# Patient Record
Sex: Male | Born: 1935 | Race: Black or African American | Hispanic: No | Marital: Married | Smoking: Former smoker
Health system: Southern US, Community
[De-identification: ages and names within clinical notes are randomized; demographics above are authoritative.]

## PROBLEM LIST (undated history)

## (undated) DIAGNOSIS — J449 Chronic obstructive pulmonary disease, unspecified: Secondary | ICD-10-CM

## (undated) DIAGNOSIS — M109 Gout, unspecified: Secondary | ICD-10-CM

## (undated) DIAGNOSIS — E785 Hyperlipidemia, unspecified: Secondary | ICD-10-CM

## (undated) DIAGNOSIS — F329 Major depressive disorder, single episode, unspecified: Secondary | ICD-10-CM

## (undated) DIAGNOSIS — K449 Diaphragmatic hernia without obstruction or gangrene: Secondary | ICD-10-CM

## (undated) DIAGNOSIS — Z860100 Personal history of colon polyps, unspecified: Secondary | ICD-10-CM

## (undated) DIAGNOSIS — I2510 Atherosclerotic heart disease of native coronary artery without angina pectoris: Secondary | ICD-10-CM

## (undated) DIAGNOSIS — I10 Essential (primary) hypertension: Secondary | ICD-10-CM

## (undated) DIAGNOSIS — B3781 Candidal esophagitis: Secondary | ICD-10-CM

## (undated) DIAGNOSIS — F32A Depression, unspecified: Secondary | ICD-10-CM

## (undated) DIAGNOSIS — C61 Malignant neoplasm of prostate: Secondary | ICD-10-CM

## (undated) DIAGNOSIS — J8410 Pulmonary fibrosis, unspecified: Secondary | ICD-10-CM

## (undated) DIAGNOSIS — Z9289 Personal history of other medical treatment: Secondary | ICD-10-CM

## (undated) DIAGNOSIS — N189 Chronic kidney disease, unspecified: Secondary | ICD-10-CM

## (undated) DIAGNOSIS — F419 Anxiety disorder, unspecified: Secondary | ICD-10-CM

## (undated) HISTORY — DX: Personal history of colon polyps, unspecified: Z86.0100

## (undated) HISTORY — DX: Candidal esophagitis: B37.81

## (undated) HISTORY — DX: Chronic kidney disease, unspecified: N18.9

## (undated) HISTORY — DX: Personal history of colonic polyps: Z86.010

## (undated) HISTORY — PX: SKIN GRAFT: SHX250

## (undated) HISTORY — DX: Depression, unspecified: F32.A

## (undated) HISTORY — DX: Major depressive disorder, single episode, unspecified: F32.9

## (undated) HISTORY — PX: COLONOSCOPY: SHX174

## (undated) HISTORY — DX: Atherosclerotic heart disease of native coronary artery without angina pectoris: I25.10

## (undated) HISTORY — DX: Anxiety disorder, unspecified: F41.9

## (undated) HISTORY — DX: Personal history of other medical treatment: Z92.89

## (undated) HISTORY — DX: Chronic obstructive pulmonary disease, unspecified: J44.9

## (undated) HISTORY — DX: Diaphragmatic hernia without obstruction or gangrene: K44.9

## (undated) HISTORY — DX: Pulmonary fibrosis, unspecified: J84.10

## (undated) HISTORY — DX: Malignant neoplasm of prostate: C61

---

## 1998-12-26 ENCOUNTER — Encounter: Admission: RE | Admit: 1998-12-26 | Discharge: 1998-12-26 | Payer: Self-pay | Admitting: Urology

## 1998-12-26 ENCOUNTER — Encounter: Payer: Self-pay | Admitting: Urology

## 2004-12-18 ENCOUNTER — Inpatient Hospital Stay (HOSPITAL_COMMUNITY): Admission: EM | Admit: 2004-12-18 | Discharge: 2004-12-22 | Payer: Self-pay | Admitting: Emergency Medicine

## 2004-12-29 ENCOUNTER — Ambulatory Visit (HOSPITAL_COMMUNITY): Admission: RE | Admit: 2004-12-29 | Discharge: 2004-12-29 | Payer: Self-pay | Admitting: Internal Medicine

## 2007-01-20 DIAGNOSIS — Z9289 Personal history of other medical treatment: Secondary | ICD-10-CM

## 2007-01-20 HISTORY — DX: Personal history of other medical treatment: Z92.89

## 2007-03-31 ENCOUNTER — Ambulatory Visit (HOSPITAL_COMMUNITY): Admission: RE | Admit: 2007-03-31 | Discharge: 2007-03-31 | Payer: Self-pay | Admitting: Internal Medicine

## 2007-04-20 ENCOUNTER — Encounter: Admission: RE | Admit: 2007-04-20 | Discharge: 2007-04-20 | Payer: Self-pay | Admitting: Orthopedic Surgery

## 2007-05-13 ENCOUNTER — Emergency Department (HOSPITAL_COMMUNITY): Admission: EM | Admit: 2007-05-13 | Discharge: 2007-05-13 | Payer: Self-pay | Admitting: Emergency Medicine

## 2007-06-15 ENCOUNTER — Ambulatory Visit: Admission: RE | Admit: 2007-06-15 | Discharge: 2007-09-13 | Payer: Self-pay | Admitting: Radiation Oncology

## 2007-09-14 ENCOUNTER — Ambulatory Visit: Admission: RE | Admit: 2007-09-14 | Discharge: 2007-11-05 | Payer: Self-pay | Admitting: Radiation Oncology

## 2010-01-19 DIAGNOSIS — I2510 Atherosclerotic heart disease of native coronary artery without angina pectoris: Secondary | ICD-10-CM

## 2010-01-19 HISTORY — DX: Atherosclerotic heart disease of native coronary artery without angina pectoris: I25.10

## 2010-03-18 ENCOUNTER — Inpatient Hospital Stay (HOSPITAL_COMMUNITY)
Admission: EM | Admit: 2010-03-18 | Discharge: 2010-03-20 | DRG: 872 | Disposition: A | Discharge status: Home / Self Care | Payer: Medicare Other | Attending: Internal Medicine | Admitting: Internal Medicine

## 2010-03-18 ENCOUNTER — Emergency Department (HOSPITAL_COMMUNITY): Payer: Medicare Other

## 2010-03-18 DIAGNOSIS — E872 Acidosis, unspecified: Secondary | ICD-10-CM | POA: Diagnosis present

## 2010-03-18 DIAGNOSIS — A419 Sepsis, unspecified organism: Principal | ICD-10-CM | POA: Diagnosis present

## 2010-03-18 DIAGNOSIS — Z87891 Personal history of nicotine dependence: Secondary | ICD-10-CM

## 2010-03-18 DIAGNOSIS — D649 Anemia, unspecified: Secondary | ICD-10-CM | POA: Diagnosis present

## 2010-03-18 DIAGNOSIS — Z8546 Personal history of malignant neoplasm of prostate: Secondary | ICD-10-CM

## 2010-03-18 DIAGNOSIS — N189 Chronic kidney disease, unspecified: Secondary | ICD-10-CM | POA: Diagnosis present

## 2010-03-18 DIAGNOSIS — E876 Hypokalemia: Secondary | ICD-10-CM | POA: Diagnosis present

## 2010-03-18 DIAGNOSIS — Z800 Family history of malignant neoplasm of digestive organs: Secondary | ICD-10-CM

## 2010-03-18 DIAGNOSIS — K449 Diaphragmatic hernia without obstruction or gangrene: Secondary | ICD-10-CM | POA: Diagnosis present

## 2010-03-18 DIAGNOSIS — J449 Chronic obstructive pulmonary disease, unspecified: Secondary | ICD-10-CM | POA: Diagnosis present

## 2010-03-18 DIAGNOSIS — E860 Dehydration: Secondary | ICD-10-CM | POA: Diagnosis present

## 2010-03-18 DIAGNOSIS — B9789 Other viral agents as the cause of diseases classified elsewhere: Secondary | ICD-10-CM | POA: Diagnosis present

## 2010-03-18 DIAGNOSIS — J4489 Other specified chronic obstructive pulmonary disease: Secondary | ICD-10-CM | POA: Diagnosis present

## 2010-03-18 DIAGNOSIS — Z8701 Personal history of pneumonia (recurrent): Secondary | ICD-10-CM

## 2010-03-18 DIAGNOSIS — D696 Thrombocytopenia, unspecified: Secondary | ICD-10-CM | POA: Diagnosis present

## 2010-03-18 DIAGNOSIS — I498 Other specified cardiac arrhythmias: Secondary | ICD-10-CM | POA: Diagnosis present

## 2010-03-18 DIAGNOSIS — K219 Gastro-esophageal reflux disease without esophagitis: Secondary | ICD-10-CM | POA: Diagnosis present

## 2010-03-18 LAB — COMPREHENSIVE METABOLIC PANEL
Alkaline Phosphatase: 72 U/L (ref 39–117)
BUN: 25 mg/dL — ABNORMAL HIGH (ref 6–23)
Chloride: 106 mEq/L (ref 96–112)
GFR calc non Af Amer: 41 mL/min — ABNORMAL LOW (ref >60)
Glucose, Bld: 118 mg/dL — ABNORMAL HIGH (ref 70–99)
Potassium: 3.1 mEq/L — ABNORMAL LOW (ref 3.5–5.1)
Total Bilirubin: 0.9 mg/dL (ref 0.3–1.2)

## 2010-03-18 LAB — CBC
MCH: 30.4 pg (ref 26.0–34.0)
MCV: 90.4 fL (ref 78.0–100.0)
Platelets: 133 10*3/uL — ABNORMAL LOW (ref 150–400)
RDW: 13.6 % (ref 11.5–15.5)
WBC: 10.2 10*3/uL (ref 4.0–10.5)

## 2010-03-18 LAB — DIFFERENTIAL
Basophils Relative: 0 % (ref 0–1)
Eosinophils Absolute: 0 10*3/uL (ref 0.0–0.7)
Eosinophils Relative: 0 % (ref 0–5)
Lymphs Abs: 0.3 10*3/uL — ABNORMAL LOW (ref 0.7–4.0)
Monocytes Relative: 3 % (ref 3–12)

## 2010-03-18 LAB — LACTIC ACID, PLASMA: Lactic Acid, Venous: 3.9 mmol/L — ABNORMAL HIGH (ref 0.5–2.2)

## 2010-03-18 LAB — POCT CARDIAC MARKERS

## 2010-03-19 ENCOUNTER — Encounter (HOSPITAL_COMMUNITY): Payer: Self-pay

## 2010-03-19 ENCOUNTER — Inpatient Hospital Stay (HOSPITAL_COMMUNITY): Payer: Medicare Other

## 2010-03-19 LAB — INFLUENZA PANEL BY PCR (TYPE A & B): H1N1 flu by pcr: NOT DETECTED

## 2010-03-19 LAB — CARDIAC PANEL(CRET KIN+CKTOT+MB+TROPI)
CK, MB: 1.7 ng/mL (ref 0.3–4.0)
CK, MB: 2.8 ng/mL (ref 0.3–4.0)
Relative Index: 1.1 (ref 0.0–2.5)
Relative Index: 1 (ref 0.0–2.5)
Total CK: 248 U/L — ABNORMAL HIGH (ref 7–232)
Total CK: 248 U/L — ABNORMAL HIGH (ref 7–232)
Troponin I: 0.02 ng/mL (ref 0.00–0.06)
Troponin I: 0.03 ng/mL (ref 0.00–0.06)

## 2010-03-19 LAB — CBC
HCT: 32.1 % — ABNORMAL LOW (ref 39.0–52.0)
MCV: 91.2 fL (ref 78.0–100.0)
RDW: 13.9 % (ref 11.5–15.5)
WBC: 12.8 10*3/uL — ABNORMAL HIGH (ref 4.0–10.5)

## 2010-03-19 LAB — BASIC METABOLIC PANEL
CO2: 22 mEq/L (ref 19–32)
Calcium: 8.5 mg/dL (ref 8.4–10.5)
GFR calc Af Amer: 52 mL/min — ABNORMAL LOW (ref >60)
GFR calc non Af Amer: 43 mL/min — ABNORMAL LOW (ref >60)
Sodium: 143 mEq/L (ref 135–145)

## 2010-03-19 LAB — URINALYSIS, ROUTINE W REFLEX MICROSCOPIC
Ketones, ur: NEGATIVE mg/dL
Nitrite: NEGATIVE
Protein, ur: NEGATIVE mg/dL
Urine Glucose, Fasting: NEGATIVE mg/dL
Urobilinogen, UA: 0.2 mg/dL (ref 0.0–1.0)

## 2010-03-19 LAB — LACTIC ACID, PLASMA: Lactic Acid, Venous: 2.6 mmol/L — ABNORMAL HIGH (ref 0.5–2.2)

## 2010-03-19 LAB — PHOSPHORUS: Phosphorus: 2.1 mg/dL — ABNORMAL LOW (ref 2.3–4.6)

## 2010-03-19 LAB — LIPID PANEL: Triglycerides: 82 mg/dL (ref <150)

## 2010-03-19 NOTE — H&P (Signed)
NAME:  James Pope, James Pope NO.:  [id number]  MEDICAL RECORD NO.:  [id number]           PATIENT TYPE:  E  LOCATION:  WLED                         FACILITY:  [facility]  PHYSICIAN:  Vania Rea, M.D. DATE OF BIRTH:  Jan 07, 1933  DATE OF ADMISSION:  03/18/2010 DATE OF DISCHARGE:                             HISTORY & PHYSICAL   PRIMARY CARE PHYSICIAN:  [name], M.D.  UROLOGIST:  Lindaann Slough, M.D.  CARDIOLOGIST:  [name], M.D.  CHIEF COMPLAINT:  Shaking chills since this afternoon.  HISTORY OF PRESENT ILLNESS:  This is a 77 year old African-American gentleman with a history of COPD, remote history of cancer of the prostate who reports he has been having episodic chest pains for the past few days, took a test with Dr. [name] yesterday but does not yet know the results, developed sudden onset of shaking chills about 3:00 p.m. this afternoon and said it felt like his previous episode of pneumonia, so he came to the emergency room to be evaluated.  He has not been having any worsening cough and the cough that he does have is nonproductive.  He vomited times once today.  No blood.  He has been short of breath.  He has been having episodic dizziness but no syncope.  PAST MEDICAL HISTORY:  Significant for cancer of the prostate in 2009, treated with radiation therapy and reports that his PSA has been undetectable ever since.  He saw Dr. [name] yesterday as noted above but does not know the results of investigations that were done.  Does have a history of GERD and hiatal hernia and has a past history of lobar pneumonia in 2006.  MEDICATIONS:  The patient is unsure of the medications he is taking.  He says he does take something for dysuria or urine problems.  He does take Prilosec, Gas-X, calcium, and Tums; but he is not sure what else.  ALLERGIES:  Initially said he was allergic to Prevacid but then changed this to he is allergic to PREDNISONE that gives  him hiccups.  ASPIRIN causes nausea and NAPROXEN causes itching.  SOCIAL HISTORY:  He has about 30-pack year tobacco use but has not smoked for several years.  Used to drink alcohol but not in excess.  He is retired.  Lives with his wife who is present at the interview.  He used to work as a Location manager and a Merchandiser, retail.  He used to do a lot of gardening work also.  CODE STATUS:  His code status is full code.  FAMILY HISTORY:  Significant for multiple family members with cancer. His father had colon cancer.  Brother had prostate cancer.  Another brother had throat cancer.  REVIEW OF SYSTEMS:  Other than noted above unremarkable.  PHYSICAL EXAMINATION:  GENERAL:  Elderly African-American gentleman, reclining in the stretcher. VITAL SIGNS:  His temperature is 101.6 rectally.  His pulse initially was 136 which slowly dipped down to 100, respiration rate is 16 and it was never more than 20 and he was never in respiratory distress.  He is saturating at 95% on room air. HEENT:  His pupils are round and  equal.  Mucous membranes pink. Anicteric.  No cervical lymphadenopathy or thyromegaly.  No carotid bruit. CHEST:  He has bibasilar crackles. CARDIOVASCULAR SYSTEM:  Regular rhythm.  No murmurs. ABDOMEN:  Soft, nontender.  No masses. EXTREMITIES:  He has a muscular built and arthritic deformities of the hands and knees. SKIN:  Warm and moist. CENTRAL NERVOUS SYSTEM: Cranial nerves II through XII are grossly intact.  He has no focal neurologic deficit.  LABORATORY DATA:  His white count is 10.2, hemoglobin 12.4, platelets 133.  He has 94% neutrophils.  Absolute neutrophil count 9.6.  There is no comment on morphology.  His sodium is 141, potassium 3.1, chloride 106, CO2 of 25, glucose 118, BUN 25, creatinine 1.65, calcium 9.4.  His liver functions are unremarkable.  His lactic acid level was 3.9.  His cardiac enzymes, myoglobin was elevated to 430.  His troponin  was undetectable.  His 2-view chest x-ray shows bibasilar infiltrates with chronic bronchitic changes.  ASSESSMENT: 1. Systemic inflammatory response syndrome with lactic acidosis. 2. Questionable sepsis. 3. Abnormal chest x-ray, questionable bilateral pneumonia. 4. History of urological abnormality, questionable urinary tract     infection. 5. Chronic obstructive pulmonary disease. 6. Gastroesophageal reflux disease. 7. Prostate cancer.  PLAN:  We will admit this gentleman for hydration and broad-spectrum antibiotic therapy, for occult sepsis.  Will get a plain CT scan of the chest to rule out pneumonia.  Will get urinalysis and culture and in fact will also get blood cultures.  Will go ahead and check a PSA.  Other plans as per orders.     Vania Rea, M.D.     LC/MEDQ  D:  03/18/2010  T:  03/18/2010  Job:  [id number]  cc:   [name], M.D. Fax: [phone]  [name], M.D. Fax: [phone]  Lindaann Slough, M.D. Fax: [phone]  Electronically Signed by Vania Rea M.D. on 03/19/2010 06:19:09 AM

## 2010-03-20 LAB — BASIC METABOLIC PANEL
BUN: 17 mg/dL (ref 6–23)
CO2: 22 mEq/L (ref 19–32)
Calcium: 8.1 mg/dL — ABNORMAL LOW (ref 8.4–10.5)
Chloride: 114 mEq/L — ABNORMAL HIGH (ref 96–112)
Creatinine, Ser: 1.45 mg/dL (ref 0.4–1.5)
Glucose, Bld: 89 mg/dL (ref 70–99)

## 2010-03-20 LAB — URINE CULTURE

## 2010-03-20 LAB — CBC
HCT: 31.7 % — ABNORMAL LOW (ref 39.0–52.0)
Hemoglobin: 10.2 g/dL — ABNORMAL LOW (ref 13.0–17.0)
MCH: 29.1 pg (ref 26.0–34.0)
MCHC: 32.2 g/dL (ref 30.0–36.0)
MCV: 90.6 fL (ref 78.0–100.0)
RBC: 3.5 MIL/uL — ABNORMAL LOW (ref 4.22–5.81)

## 2010-03-25 LAB — CULTURE, BLOOD (ROUTINE X 2)
Culture  Setup Time: 201202290945
Culture: NO GROWTH

## 2010-03-31 ENCOUNTER — Ambulatory Visit
Admission: RE | Admit: 2010-03-31 | Discharge: 2010-03-31 | Disposition: A | Discharge status: Home / Self Care | Payer: Medicare Other | Source: Ambulatory Visit | Attending: Internal Medicine | Admitting: Internal Medicine

## 2010-03-31 ENCOUNTER — Other Ambulatory Visit: Payer: Self-pay | Admitting: Internal Medicine

## 2010-03-31 DIAGNOSIS — J189 Pneumonia, unspecified organism: Secondary | ICD-10-CM

## 2010-04-04 ENCOUNTER — Ambulatory Visit (HOSPITAL_COMMUNITY)
Admission: RE | Admit: 2010-04-04 | Discharge: 2010-04-04 | Disposition: A | Discharge status: Home / Self Care | Payer: Medicare Other | Source: Ambulatory Visit | Attending: Cardiovascular Disease | Admitting: Cardiovascular Disease

## 2010-04-04 DIAGNOSIS — I2510 Atherosclerotic heart disease of native coronary artery without angina pectoris: Secondary | ICD-10-CM | POA: Insufficient documentation

## 2010-04-04 DIAGNOSIS — E785 Hyperlipidemia, unspecified: Secondary | ICD-10-CM | POA: Insufficient documentation

## 2010-04-04 DIAGNOSIS — Z87891 Personal history of nicotine dependence: Secondary | ICD-10-CM | POA: Insufficient documentation

## 2010-04-04 DIAGNOSIS — I10 Essential (primary) hypertension: Secondary | ICD-10-CM | POA: Insufficient documentation

## 2010-04-04 HISTORY — PX: CARDIAC CATHETERIZATION: SHX172

## 2010-04-07 NOTE — Discharge Summary (Signed)
NAME:  James Pope, James Pope NO.:  [id number]  MEDICAL RECORD NO.:  [id number]           PATIENT TYPE:  I  LOCATION:  1317                         FACILITY:  [facility]  PHYSICIAN:  Marcellus Scott, MD     DATE OF BIRTH:  1932-12-23  DATE OF ADMISSION:  03/18/2010 DATE OF DISCHARGE:  03/20/2010                              DISCHARGE SUMMARY   PRIMARY CARE PHYSICIAN:  [name], M.D.  UROLOGIST:  Lindaann Slough, M.D.  CARDIOLOGIST:  [name], M.D.  DISCHARGE DIAGNOSES: 1. Systemic inflammatory response syndrome. 2. Possible nonspecific viral illness. 3. Chronic kidney disease. 4. Anemia. 5. Thrombocytopenia. 6. Mild dehydration, resolved. 7. Hypokalemia. 8. Chronic obstructive pulmonary disease. 9. History of prostate cancer. 10.History of gastroesophageal reflux disease and hiatal hernia.  DISCHARGE MEDICATIONS: 1. Tylenol 650 mg p.o. q. 4 hourly p.r.n. for pain or fever. 2. Calcium 600 mg p.o. b.i.d. 3. Gas-X 1 tablet p.o. t.i.d. with meals. 4. Prilosec 20 mg p.o. daily. 5. Tamsulosin 0.4 mg p.o. daily. 6. Tums 1-2 tablets p.o. daily p.r.n. for heartburn.  DISCONTINUED MEDICATIONS:  Aleve.  IMAGING: 1. CT of the chest without contrast on February 29.  Impression, there     has been overall progression of pulmonary parenchymal disease.  It     is a combination of emphysema and most likely fibrotic changes. 2. Chest x-ray, March 18, 2010.  Impression, bibasilar infiltrates.  LABORATORY DATA:  Blood cultures x2 are no growth to date.  Basic metabolic panel remarkable for potassium 3.3, BUN 17, creatinine 1.45. CBC, hemoglobin 10.2, hematocrit 32, white blood cells 6.9, platelets are 119.  Urine culture, no growth.  Cardiac enzymes only showed mild rhabdomyolysis but troponins were negative.  PSA was 0.21.  Influenza A and B PCR and H1N1 PCR were negative.  Lipid panel showed HDL of 24, otherwise within normal limits.  Venous lactate initially was  3.9, which came down to 2.6 yesterday.  Phosphorus 2.1, magnesium is 1.7.  MRSA PCR negative.  Urinalysis showed 7-10 red blood cells.  Hepatic panel within normal limits.  CONSULTATIONS:  None.  DIET:  Heart healthy diet.  ACTIVITIES:  Increase activity slowly and then as tolerated.  COMPLAINTS TODAY:  Mild increase in cough with white sputum but no chest pain, dyspnea or fevers.  PHYSICAL EXAMINATION:  GENERAL:  The patient is in no obvious distress. VITAL SIGNS:  Temperature 98.6 degrees Fahrenheit with T-maximum of 99.3, pulse 71 per minute and regular, respirations 16 per minute, blood pressure 118/69 mmHg and saturating at 92% on room air. RESPIRATORY:  With few basal chronic sounding crackles but otherwise clear to auscultation and no increased work of breathing. CARDIOVASCULAR:  First and second heart sounds are regular.  No JVD. ABDOMEN:  Soft and bowel sounds present.  Nontender. CENTRAL NERVOUS SYSTEM:  Patient is awake, alert, oriented x3 with no focal neurological deficits. EXTREMITIES:  With grade 5/5 power.HOSPITAL COURSE:  Mr. Beitler is a pleasant 77 year old African American male patient with history of COPD, prostate cancer status post radiation who presented with sudden onset of shaking chills which felt like his previous episode of pneumonia.  He thereby came to the emergency room. He had been evaluated as an outpatient for episodic chest pains but did not complain of any chest pains in the hospital.  He vomited x1.  He indicated that he was well until 3 p.m. on February 28, when he started having these symptoms.  He also had associated joint pains.  He denied sore throat, earache, change in his cough, chest pain, dyspnea, abdominal pain, diarrhea or skin rash.  There were no sickly contacts. He has history of chronic intermittent difficulty or pain on passing urine, which has not changed.  Evaluation in the emergency room revealed temperature of 101.6, pulse  rate of 136 with initial white count of 10.2, and he had a lactate which was elevated.  He was admitted to the step-down unit with a SIRS-like picture of unknown etiology.  1. Systemic inflammatory response syndrome, possibly from nonspecific     viral illness.  He was empirically spaced on broad-spectrum     antibiotics including Zosyn, vancomycin and ciprofloxacin.     However, his symptomatology was not consistent with a particular     source of infection.  A CT of the chest did not reveal a pneumonia.     Within 24 hours with hydration, he did quite well.  His vancomycin     and Zosyn were discontinued.  The patient was transferred to a     regular bed and his Cipro was continued until his urine cultures     were back.  Today, he indicates that he is back to his baseline,     except a minimal white cough which is slightly worse than the     usual.  He does not complain of any dyspnea.  His flu virus PCRs     were negative. 2. Chronic kidney disease, possibly at baseline.  Follow up [facility] as an     outpatient. 3. Anemia and thrombocytopenia, stable.  Again recommend outpatient     followup of CBC. 4. Mild dehydration, resolved with IV fluids. 5. Hypokalemia, we will replete prior to discharge. 6. COPD.  The patient denies any symptoms. 7. Prostate cancer.  Outpatient followup with his urologist.   DISPOSITION:  The patient is discharged home in stable condition.  FOLLOWUP RECOMMENDATIONS: 1. With Dr. [name] in 5-7 days time with repeat CBC and basic     metabolic panel. 2. With Dr. Su Grand.  The patient is to call for an appointment. 3. With Dr. [name].  The patient is to call for an appointment. This dictator called and discussed regarding recently performed outpatient stress test and was advised to have patient followup as outpatient with Cardiology.  Time taken in coordinating this discharge is 35 minutes.     Marcellus Scott, MD     AH/MEDQ  D:   03/20/2010  T:  03/20/2010  Job:  [id number]  cc:   [name], M.D. Fax: [phone]  [name], M.D. Fax: [phone]  Lindaann Slough, M.D. Fax: [phone]  Electronically Signed by Marcellus Scott MD on 04/07/2010 01:46:01 PM

## 2010-04-10 NOTE — Procedures (Signed)
NAME:  James Pope, James Pope NO.:  [id number]  MEDICAL RECORD NO.:  [id number]           PATIENT TYPE:  O  LOCATION:  [facility]                         FACILITY:  [facility]  PHYSICIAN:  [name], M.D.     DATE OF BIRTH:  1932/07/13  DATE OF PROCEDURE:  04/04/2010 DATE OF DISCHARGE:  04/04/2010                           CARDIAC CATHETERIZATION   INDICATIONS:  Mr. Prajwal Fellner is a 77 year old African American gentleman with a greater than 50-year history of tobacco use and he quit approximately 10 years ago.  He has a history of hypertension as well as hyperlipidemia and has documented left ventricular hypertrophy with diastolic dysfunction.  He had recently undergone a Persantine Myoview study which suggested mild to moderate inferolateral ischemia, which was new when compared to his previous test of 2007.  In light of his risk factor profile and suspicion for possible distal circumflex ischemia, definitive cardiac catheterization was recommended.  PROCEDURE:  After premedication with Versed 1 mg plus fentanyl 25 mcg, the patient was prepped and draped in the usual fashion.  His right femoral artery was punctured anteriorly and a 5-French sheath was inserted without difficulty.  Diagnostic cardiac catheterization was done utilizing 5-French Judkins #4 left and right coronary catheters.  A 5-French pigtail catheter was used for left ventriculography as well as distal aortography.  Hemostasis was obtained by direct manual pressure. The patient tolerated the procedure well and returned to his room in satisfactory condition.  HEMODYNAMIC DATA:  Central aortic pressure was 140/72.  Left ventricular pressure 140/7.  Post A-wave 21.  ANGIOGRAPHIC DATA:  Left main coronary artery was angiographically normal and bifurcated into an LAD and left circumflex system.  The LAD gave rise to a proximal diagonal vessel.  The second diagonal vessel was a diminutive vessel that had 80%  ostial narrowing.  The remainder of the LAD system was angiographically normal and gave rise to a proximal large septal perforating artery and moderate-sized mid diagonal vessel and the LAD distally extended to the LV apex.  The circumflex vessel was a large vessel that gave rise to 3 marginal vessels and ended in a posterolateral coronary artery.  The third marginal vessel which supplied the distal inferolateral wall had diffuse smooth narrowing of 50 to less than 60%.  The remainder of the circumflex was angiographically normal.  The right coronary artery was angiographically normal and had slightly misaligned takeoff.  There was no significant stenosis.  The vessel supplied a large PDA system and was free of significant disease.  RAO ventriculography revealed normal LV contractility with ejection fraction at least 60%.  Distal aortography revealed patent renal arteries without renal artery stenosis.  There was no significant obstructive disease.  IMPRESSION: 1. Normal left ventricular function. 2. Evidence for coronary artery disease involving the ostium of a very     small second diagonal branch of the left anterior descending with     ostial narrowing of 80%. 3. Diffuse 50-60% smooth narrowing in the third obtuse marginal artery     vessel arising in the distal circumflex coronary artery supplying     the inferolateral wall. 4. Normal  right coronary artery. 5. No significant aortoiliac disease.  DISCUSSION:  Mr. Wey nuclear scan did show area of ischemia inferolaterally.  This most likely is due to the distal circumflex marginal 3 diffuse narrowing of 50-60%.  Aggressive medical therapy will be recommended as initial treatment.  He tolerated the procedure well.          ______________________________ [name], M.D.     TK/MEDQ  D:  04/04/2010  T:  04/05/2010  Job:  [id number]  cc:   [name], M.D.  Electronically Signed by [name] M.D. on  04/10/2010 03:33:07 PM

## 2010-06-06 NOTE — Discharge Summary (Signed)
NAME:  KASIM, MCCORKLE NO.:  [id number]   MEDICAL RECORD NO.:  [id number]          PATIENT TYPE:  INP   LOCATION:  1307                         FACILITY:  [facility]   PHYSICIAN:  [name], M.D.    DATE OF BIRTH:  03-15-1932   DATE OF ADMISSION:  12/18/2004  DATE OF DISCHARGE:  12/22/2004                                 DISCHARGE SUMMARY   DISCHARGE DIAGNOSES:  1.  Lobar pneumonia.  2.  Chronic obstructive pulmonary disease.  3.  Gastroesophageal reflux disease.  4.  Prostatic hypertrophy.   REASON FOR ADMISSION:  Mr. Cordova is a 77 year old African American  gentleman with history of a half to one pack per day cigarette smoking or  greater than 30 pack year history along with COPD who is admitted with a 24-  48 hour malaise and several episodes of shaking chills.  Because of the  latter, the patient was brought into the emergency room by family members  where he was found to be febrile to 102, and radiologic changes consistent  with pneumonia.   His pertinent evaluation consisted of a normal white count of 7000,  urinalysis was clear and otherwise, negative for nitrites and leukocyte  esterase.  Hematocrit 37 and hemoglobin 12.8.  His C-met was completely  normal with the exception of a potassium that is low at 3.3.  Normal liver  function studies, and although he had a low free T4, his TSH third  generation was normal at 1.09.  A screening ACTH__________ was 18 with a  cortisol of 7.1.  A serum Aldosterone level of less than 1.6.  PSA is normal  at 3.56.  Blood cultures grew out Klebsiella pneumonia along with  streptococcus viridans.  He Klebsiella was sensitive to all antibiotics  tested with the exception of ampicillin.  His urine culture revealed no  growth.  Chest x-ray revealed a right lower lobe pneumonia.  Upper lung  zones were clear.   HOSPITAL COURSE:  The patient was admitted with evidence of right lower lobe  pneumonia on chest x-ray, and  with his history of shaking chills and rigors,  the likelihood of bacteremia was felt to be quite likely.  As noted above,  he did grow out two organisms out of the blood Klebsiella pneumoniae as well  as strep viridans.  The patient had no other evidence to suggest bacterial  endocarditis.  He never developed a heart murmur, and his response to  antibiotic therapy was fairly prompt and his pulse oximetry demonstrated  good aeration with an excellent AA gradient.  The patient's cultures grew  out Gram negative rods and he was switched from his Zithromax to  Ciprofloxacin at 400 mg IV q.12 h.  Gram positive cocci was felt to be  covered adequately by both the Rocephin and the Cipro.  The patient, over  the next 48 hours, became afebrile with a general sense of well-being,  significantly improved since his admission.  Because of a situation where he  was admitted with hypokalemia and was not on a diuretic at home, it was felt  that  the patient should have a Cushing's syndrome or other etiologies of  hypokalemia excluded, and for this reason, a random cortisol and ACTH was  performed.  His cortisol level was in fact 7.1 and felt to be not clinically  elevated and he had an appropriate ACTH for that particular cortisol level.  He had a serum aldosterone level that was not significantly elevated as  well.  He was given potassium replacement therapy prior to his discharge.   In addition to the antibiotics listed above, the patient also had albuterol  nebulizer treatment as well as incentive spirometry, based on the pulmonary  protocol of the hospital and the approval of the Intensive and Pulmonary  Department here at [facility].  The patient had no other  significant problems.  He was subsequently discharged home on Avelox at 400  mg p.o. daily.  He was advised to continue low dose aspirin at 81 mg daily  as well as his anti-BPH medications being prescribed by Dr. [name] and  Tussionex  2.5-5 mg q.12 h p.r.n. cough.  The patient is scheduled to have a  repeat chest x-ray performed prior to being seen in the office in two weeks  to ensure that he has had sufficient clearing of his infiltrate.   CONDITION ON DISCHARGE:  Significantly improved.   PROGNOSIS:  Good.           ______________________________  [name], M.D.     PC/MEDQ  D:  01/13/2005  T:  01/13/2005  Job:  [id number]

## 2010-10-14 LAB — DIFFERENTIAL
Eosinophils Absolute: 0.1
Lymphs Abs: 1.4
Monocytes Relative: 16 — ABNORMAL HIGH
Neutro Abs: 3.3
Neutrophils Relative %: 59

## 2010-10-14 LAB — CBC
Hemoglobin: 12.5 — ABNORMAL LOW
MCHC: 33.5
Platelets: 175
RDW: 14

## 2010-10-14 LAB — COMPREHENSIVE METABOLIC PANEL
ALT: 11
Albumin: 3.6
Calcium: 10.5
Glucose, Bld: 96
Sodium: 141
Total Protein: 7.7

## 2010-10-14 LAB — APTT: aPTT: 44 — ABNORMAL HIGH

## 2010-10-14 LAB — PROTIME-INR: INR: 1

## 2010-10-14 LAB — POCT CARDIAC MARKERS: Operator id: 5362

## 2010-10-14 LAB — D-DIMER, QUANTITATIVE: D-Dimer, Quant: 1.37 — ABNORMAL HIGH

## 2010-11-18 ENCOUNTER — Inpatient Hospital Stay (HOSPITAL_COMMUNITY)
Admission: AD | Admit: 2010-11-18 | Discharge: 2010-11-19 | DRG: 310 | Disposition: A | Discharge status: Home / Self Care | Payer: Medicare Other | Source: Other Acute Inpatient Hospital | Attending: Cardiovascular Disease | Admitting: Cardiovascular Disease

## 2010-11-18 ENCOUNTER — Emergency Department (HOSPITAL_COMMUNITY): Payer: Medicare Other

## 2010-11-18 ENCOUNTER — Encounter (HOSPITAL_COMMUNITY): Payer: Self-pay

## 2010-11-18 ENCOUNTER — Emergency Department (HOSPITAL_COMMUNITY)
Admission: EM | Admit: 2010-11-18 | Discharge: 2010-11-18 | Disposition: A | Discharge status: Other Acute Inpatient Hospital | Payer: Medicare Other | Source: Home / Self Care | Attending: Emergency Medicine | Admitting: Emergency Medicine

## 2010-11-18 DIAGNOSIS — I10 Essential (primary) hypertension: Secondary | ICD-10-CM | POA: Insufficient documentation

## 2010-11-18 DIAGNOSIS — Z8546 Personal history of malignant neoplasm of prostate: Secondary | ICD-10-CM

## 2010-11-18 DIAGNOSIS — J449 Chronic obstructive pulmonary disease, unspecified: Secondary | ICD-10-CM | POA: Diagnosis present

## 2010-11-18 DIAGNOSIS — I472 Ventricular tachycardia, unspecified: Secondary | ICD-10-CM | POA: Insufficient documentation

## 2010-11-18 DIAGNOSIS — M109 Gout, unspecified: Secondary | ICD-10-CM | POA: Diagnosis present

## 2010-11-18 DIAGNOSIS — J4489 Other specified chronic obstructive pulmonary disease: Secondary | ICD-10-CM | POA: Diagnosis present

## 2010-11-18 DIAGNOSIS — R55 Syncope and collapse: Secondary | ICD-10-CM | POA: Diagnosis present

## 2010-11-18 DIAGNOSIS — I4729 Other ventricular tachycardia: Secondary | ICD-10-CM | POA: Insufficient documentation

## 2010-11-18 DIAGNOSIS — E785 Hyperlipidemia, unspecified: Secondary | ICD-10-CM | POA: Diagnosis present

## 2010-11-18 DIAGNOSIS — Z7982 Long term (current) use of aspirin: Secondary | ICD-10-CM

## 2010-11-18 DIAGNOSIS — I2510 Atherosclerotic heart disease of native coronary artery without angina pectoris: Secondary | ICD-10-CM | POA: Diagnosis present

## 2010-11-18 DIAGNOSIS — I498 Other specified cardiac arrhythmias: Principal | ICD-10-CM | POA: Diagnosis present

## 2010-11-18 LAB — COMPREHENSIVE METABOLIC PANEL
AST: 25 U/L (ref 0–37)
Albumin: 3.3 g/dL — ABNORMAL LOW (ref 3.5–5.2)
CO2: 27 mEq/L (ref 19–32)
Calcium: 9.6 mg/dL (ref 8.4–10.5)
Creatinine, Ser: 1.51 mg/dL — ABNORMAL HIGH (ref 0.50–1.35)
GFR calc non Af Amer: 43 mL/min — ABNORMAL LOW (ref >90)

## 2010-11-18 LAB — CBC
MCH: 31.1 pg (ref 26.0–34.0)
MCV: 92.6 fL (ref 78.0–100.0)
Platelets: 133 10*3/uL — ABNORMAL LOW (ref 150–400)
RDW: 13.7 % (ref 11.5–15.5)

## 2010-11-18 LAB — DIFFERENTIAL
Eosinophils Absolute: 0.1 10*3/uL (ref 0.0–0.7)
Eosinophils Relative: 2 % (ref 0–5)
Lymphs Abs: 1.6 10*3/uL (ref 0.7–4.0)
Monocytes Absolute: 0.7 10*3/uL (ref 0.1–1.0)
Monocytes Relative: 11 % (ref 3–12)

## 2010-11-18 LAB — CK TOTAL AND CKMB (NOT AT ARMC)
CK, MB: 4 ng/mL (ref 0.3–4.0)
CK, MB: 3.9 ng/mL (ref 0.3–4.0)
CK, MB: 2.9 ng/mL (ref 0.3–4.0)
Relative Index: 1.5 (ref 0.0–2.5)
Relative Index: 1.5 (ref 0.0–2.5)
Relative Index: 1.2 (ref 0.0–2.5)
Total CK: 260 U/L — ABNORMAL HIGH (ref 7–232)
Total CK: 238 U/L — ABNORMAL HIGH (ref 7–232)

## 2010-11-18 LAB — TROPONIN I
Troponin I: <0.3 ng/mL (ref <0.30)
Troponin I: <0.3 ng/mL (ref <0.30)

## 2010-11-18 LAB — PROTIME-INR: INR: 1.06 (ref 0.00–1.49)

## 2010-11-18 LAB — MRSA PCR SCREENING: MRSA by PCR: NEGATIVE

## 2010-11-18 LAB — TSH: TSH: 2.209 u[IU]/mL (ref 0.350–4.500)

## 2010-11-19 ENCOUNTER — Inpatient Hospital Stay (HOSPITAL_COMMUNITY): Payer: Medicare Other

## 2010-11-19 LAB — BASIC METABOLIC PANEL
BUN: 17 mg/dL (ref 6–23)
Calcium: 9.4 mg/dL (ref 8.4–10.5)
GFR calc Af Amer: 59 mL/min — ABNORMAL LOW (ref >90)
GFR calc non Af Amer: 51 mL/min — ABNORMAL LOW (ref >90)
Glucose, Bld: 101 mg/dL — ABNORMAL HIGH (ref 70–99)
Potassium: 3.7 mEq/L (ref 3.5–5.1)
Sodium: 139 mEq/L (ref 135–145)

## 2010-11-19 MED ORDER — IOHEXOL 300 MG/ML  SOLN
100.0000 mL | Freq: Once | INTRAMUSCULAR | Status: AC | PRN
  Administered 2010-11-19: 100 mL via INTRAVENOUS

## 2010-11-21 NOTE — Discharge Summary (Addendum)
  NAME:  James Pope, TA NO.:  [id number]  MEDICAL RECORD NO.:  [id number]  LOCATION:  WLED                         FACILITY:  [facility]  PHYSICIAN:  Nanetta Batty, M.D.   DATE OF BIRTH:  Jun 09, 1932  DATE OF ADMISSION:  11/18/2010 DATE OF DISCHARGE:  11/18/2010                              DISCHARGE SUMMARY   ADDENDUM  Please note the patient's CT angio of the chest found no pulmonary embolus, normal thoracic aorta except for atherosclerotic changes, stable, advanced emphysematous changes, scarring and dependent atelectasis.  Also a new 5 mm spiculated right upper lobe lung lesion. Recommend followup noncontrast CT of chest in 6 months to evaluate.   The patient therefore was evaluated, CT evaluated by Dr. Herbie Baltimore and stable for discharge home.  We will follow up as previously instructed.     Darcella Gasman. Annie Paras, N.P.   ______________________________ Nanetta Batty, M.D.    LRI/MEDQ  D:  11/19/2010  T:  11/20/2010  Job:  [id number]  cc:   Nanetta Batty, M.D.  Electronically Signed by Nada Boozer N.P. on 11/21/2010 09:26:34 AM Electronically Signed by Nanetta Batty M.D. on 11/21/2010 05:27:26 PM

## 2011-01-17 NOTE — Discharge Summary (Signed)
NAMEMarland Kitchen  James, Pope NO.:  [id number]  MEDICAL RECORD NO.:  [id number]  LOCATION:  2907                         FACILITY:  [facility]  PHYSICIAN:  James Pope, M.D.     DATE OF BIRTH:  [birth year]  DATE OF ADMISSION:  11/18/2010 DATE OF DISCHARGE:  11/19/2010                              DISCHARGE SUMMARY   DISCHARGE DIAGNOSES: 1. Presyncopal secondary to transient bradycardia.     a.     Decrease of beta blocker. 2. Paroxysmal supraventricular tachycardia short burst during     hospitalization. 3. There was a question of nonsustained ventricular tachycardia, but     please note it was not it was all artifact. 4. Coronary artery disease with nonobstructive left anterior     descending disease with normal ejection fraction by cardiac cath in     March 2012. 5. Dyslipidemia, stable. 6. Chronic obstructive pulmonary disease. 7. Gout. 8. Abnormal D-dimer.  CT results pending.  DISCHARGE CONDITION:  Improved.  PROCEDURES:  CT angio of the chest.  DISCHARGE MEDICATIONS:  See medication reconciliation sheet.  DISCHARGE INSTRUCTIONS: 1. Increase activity slowly. 2. Heart healthy diet. 3. Follow up with Dr. Tresa Pope on December 23, 2010 at 11:00 p.m. 4. You will need to wear a monitor for 30 days.  On December 05, 2010     at 3:00 p.m. you will go to office and have placed.  HOSPITAL COURSE:  This 77 year old African American male followed by Dr. Margaretmary Pope for primary care and Dr. Tresa Pope for Cardiology and was admitted to the hospital on November 18, 2010, after he had become hot and sweaty while cooking at the stove the night prior to admission.  He sat down on the floor.  He did not have a true syncopal spell, but near syncopal spell and went to [facility], he was in sinus rhythm, sinus brady with rate is down to 52.  He was admitted to telemetry.  It was reported he had V-tach on telemetry, but it was only artifact when evaluation of  strips were done.  He was then transferred to [facility] CCU for further evaluation.  The patient has no chest pain, have intermittent dyspnea on exertion, and he has known COPD.  He had no nausea, vomiting or palpitations.  He was diaphoretic with the episode.  His troponins were negative.  His CK was elevated, but MB and troponins were negative.  Due to the bradycardia, his atenolol has been decreased to 25 mg daily.  PAST MEDICAL HISTORY:  Positive for prostate cancer with radiation therapy in 2009 followed by Dr. Brunilda Pope, has a history of gastroesophageal reflux disease.  He has dyslipidemia.  He has COPD and also hypertension.  He is intolerant to ASPIRIN due to GI upset and intolerance to PREDNISONE.  D-dimer during hospitalization was slightly elevated at 0.49 where doing a CT angiogram of the chest to rule out PE and if this is negative we feel he could be discharged home safely.  It was felt the near syncopal episode was either vasovagal or due to bradycardia.  Here in the hospital, he does have some SVT with a rate of approximately 150  beats per minute just a short 5 beat run of it then he goes back to bradycardia.  Prior to discharge, the patient ambulated in the hall without complications orthostatics blood pressures, lying down blood pressure was 117/77, sitting 128/80, and standing 137/92.  LABORATORY DATA:  Hemoglobin 11.4, hematocrit 34, WBC 5.9, and platelets are somewhat low 133, neutrophils 60, lymphs 26, mono 11, and eos 2. Pro time 14.  INR 1.06.  D-dimer is 0.49.  Chemistry, sodium 139, potassium 3.7, chloride 104, CO2 24, glucose 101, BUN 17, creatinine 1.32 down from 1.51.  Total bili 0.4, alkaline phos 87, SGOT 25, SGPT 8, total protein 7.5, albumin 3.3, calcium 9.6.  CKs of 264, 260, 268.  MBs negative at 4, 3.9, 2.9.  Troponin I less than 0.30 x3.  TSH of 1.401 though initially 2.209.  MRSA screen was negative.  Chest x-ray show inspiration with  increasing airspace disease in the lung bases suggesting atelectasis or pneumonia.  Head CT on admission, chronic atrophic and small vessel ischemic changes.  No evidence of acute intracranial hemorrhage, mass, lesion or acute infarct.  Again, CT angio of the chest is pending.  The patient has been stable.  No further complications.  Plan will be for discharge home.  If the CT angio was negative.  Please see addendum to discharge for further information on the CT angio.     James Pope. James Pope, N.P.   ______________________________ James Pope, M.D.    LRI/MEDQ  D:  11/19/2010  T:  11/19/2010  Job:  [id number]  Electronically Signed by James Pope N.P. on 11/19/2010 05:24:51 PM Electronically Signed by James Pope M.D. on 01/17/2011 02:17:28 PM

## 2011-01-17 NOTE — H&P (Signed)
  NAMEMarland Kitchen  EAIN, MULLENDORE NO.:  [id number]  MEDICAL RECORD NO.:  [id number]  LOCATION:  WLED                         FACILITY:  [facility]  PHYSICIAN:  Abelino Derrick, P.A.   DATE OF BIRTH:  09/28/1932  DATE OF ADMISSION:  11/18/2010 DATE OF DISCHARGE:  11/18/2010                             HISTORY & PHYSICAL   ADDENDUM  Mr. Yakel in addition to the above diagnosis he also has a stage III renal insufficiency with a creatinine of 1.5.     Abelino Derrick, P.Memory Dance  D:  11/18/2010  T:  11/18/2010  Job:  [id number]  Electronically Signed by Corine Shelter P.A. on 11/18/2010 09:01:50 AM Electronically Signed by [name] M.D. on 01/17/2011 02:17:24 PM

## 2011-01-17 NOTE — H&P (Signed)
NAMEMarland Pope  JARRAD, MCLEES NO.:  [id number]  MEDICAL RECORD NO.:  [id number]  LOCATION:  2907                         FACILITY:  [facility]  PHYSICIAN:  Dr. [name]              DATE OF BIRTH:  1932-11-30  DATE OF ADMISSION:  11/18/2010 DATE OF DISCHARGE:                             HISTORY & PHYSICAL   CHIEF COMPLAINT:  Near syncope.  HISTORY OF PRESENT ILLNESS:  Mr. James Pope is a pleasant 77 year old male followed by Dr. [name].  He had a catheterization in March 2012 after an abnormal Myoview as an outpatient.  Catheterization revealed 80% small diagonal and 50% to 60% distal circumflex, but otherwise normal coronaries and normal LV function.  Plan was for medical therapy. He was being evaluated at that time for some chest pain.  Since then, he has done well from a cardiac standpoint.  Last evening, he was at the stove cooking.  He became hot and sweaty.  He then sat down on the floor.  He did not have a true syncopal spell but more of a near syncopal spell.  He was brought to the emergency room at [facility].  His EKG showed sinus rhythm, sinus bradycardia with a rate of 52.  He was admitted to telemetry.  He reportedly had "V-tach" on telemetry, but on review of the strips, it looks more like artifact.  He is transferred to [facility] CCU for further evaluation.  He denies any chest pain.  He does have some intermittent dyspnea on exertion, but he has known COPD.  He has not had near syncopal spells in the past.  He denies any associated nausea, vomiting, or palpitations.  He did get diaphoretic with this episode.  So far, his troponins are negative, and his EKG shows no acute changes except for some bradycardia at baseline.  PAST MEDICAL HISTORY:  Remarkable for prostate cancer, he had radiation therapy in 2009, he has been followed by Dr. [name] every 6 months.  He has gastroesophageal reflux.  He has dyslipidemia.  He has COPD by history and symptoms.   He has hypertension with a history of hypertensive cardiovascular disease per previous echocardiogram per Dr. [name].  HOME MEDICATIONS: 1. Tums p.r.n. 2. Flomax 0.4 mg a day. 3. Prilosec 20 mg a day. 4. Multivitamin daily. 5. Imdur 30 mg a day. 6. Colchicine 0.6 daily. 7. Atorvastatin 20 mg daily. 8. Atenolol 50 mg a day. 9. Aspirin 81 mg a day. 10.Allegra 60 mg a day p.r.n. 11.Tylenol p.r.n.  He has intolerance to aspirin which caused some GI upset as well as intolerance to prednisone.  SOCIAL HISTORY:  He is married, he is retired Location manager.  He smoked for several years, but quit 10 years ago.  He does not drink alcohol.  He has 2 children, 6 grandchildren, 1 great-grandchild.  FAMILY HISTORY:  Remarkable for cancer but unremarkable for coronary artery disease.  REVIEW OF SYSTEMS:  The patient did have pneumonia in the past.  He says he was treated in the spring of this year again for pneumonia.  He denies any hemoptysis.  He denies any tachycardia or palpitations.  He has not had lower extremity edema or painful swelling in his legs.  PHYSICAL EXAM:  VITAL SIGNS: Blood pressure 122/88, pulse 52, respirations 12. GENERAL: He is a well-developed, well-nourished male, in no acute distress. HEENT:  Normocephalic. Extraocular movements are intact.  Sclerae are nonicteric.  Lids and conjunctivae are within normal limits. NECK: Without JVD or bruit. CHEST:  Clear to auscultation percussion. CARDIAC:  Reveals regular rate and rhythm without murmur, rub, or gallop.  Normal S1, S2. ABDOMEN:  Nontender, nondistended. EXTREMITIES:  Without edema.  Distal pulses are 2+ over 4 bilaterally. NEURO: Grossly intact.  He is awake, alert, oriented, cooperative. SKIN: Cool and dry.  LABORATORY DATA:  White count 5.9, hemoglobin 11.4, hematocrit 34, platelets 133.  Sodium 137, potassium 3.8, BUN 19, creatinine 1.5, troponin is negative x1.  EKG shows sinus rhythm, sinus  bradycardia without acute changes.  QTc is 400.  Chest x-ray shows airspace disease at the bases suggesting atelectasis.  CT of his head shows chronic small vessel changes.  No acute change.  IMPRESSION: 1. Near syncopal spell, suspect this is most likely secondary to     transient bradycardia. 2. "Nonsustained ventricular tachycardia," this appears to be artifact     on telemetry and not real ventricular tachycardia. 3. Minor coronary artery disease with catheterizations in March 2012. 4. Treated hypertension with hypertensive cardiovascular disease per     Dr. Landry Dyke note of March 2012. 5. Treated dyslipidemia. 6. History of prostate cancer treated with radiation therapy in 2009     and doing well. 7. History of gastroesophageal reflux. 8. Chronic obstructive pulmonary disease.  PLAN:  The patient will be monitored in the intensive care unit.  We will go ahead and cycle his enzymes.  We will check a D-dimer.  For now, we will hold his beta-blocker.  Further recommendations to follow after the patient is evaluated by cardiologist this morning.     Abelino Derrick, P.A.   ______________________________ Dr. Philomena Doheny  D:  11/18/2010  T:  11/18/2010  Job:  [id number]  Electronically Signed by Corine Shelter P.A. on 11/19/2010 10:12:13 PM Electronically Signed by [name] M.D. on 01/17/2011 02:17:33 PM

## 2011-08-06 ENCOUNTER — Observation Stay (HOSPITAL_COMMUNITY)
Admission: AD | Admit: 2011-08-06 | Discharge: 2011-08-08 | Disposition: A | Discharge status: Home / Self Care | Payer: Medicare Other | Attending: Internal Medicine | Admitting: Internal Medicine

## 2011-08-06 ENCOUNTER — Emergency Department (HOSPITAL_COMMUNITY): Payer: Medicare Other

## 2011-08-06 DIAGNOSIS — I10 Essential (primary) hypertension: Secondary | ICD-10-CM | POA: Diagnosis present

## 2011-08-06 DIAGNOSIS — D649 Anemia, unspecified: Secondary | ICD-10-CM | POA: Insufficient documentation

## 2011-08-06 DIAGNOSIS — N189 Chronic kidney disease, unspecified: Secondary | ICD-10-CM | POA: Insufficient documentation

## 2011-08-06 DIAGNOSIS — R079 Chest pain, unspecified: Principal | ICD-10-CM | POA: Insufficient documentation

## 2011-08-06 DIAGNOSIS — E785 Hyperlipidemia, unspecified: Secondary | ICD-10-CM | POA: Insufficient documentation

## 2011-08-06 DIAGNOSIS — K219 Gastro-esophageal reflux disease without esophagitis: Secondary | ICD-10-CM | POA: Insufficient documentation

## 2011-08-06 DIAGNOSIS — D696 Thrombocytopenia, unspecified: Secondary | ICD-10-CM | POA: Insufficient documentation

## 2011-08-06 DIAGNOSIS — M109 Gout, unspecified: Secondary | ICD-10-CM | POA: Insufficient documentation

## 2011-08-06 DIAGNOSIS — I129 Hypertensive chronic kidney disease with stage 1 through stage 4 chronic kidney disease, or unspecified chronic kidney disease: Secondary | ICD-10-CM | POA: Insufficient documentation

## 2011-08-06 HISTORY — DX: Hyperlipidemia, unspecified: E78.5

## 2011-08-06 HISTORY — DX: Essential (primary) hypertension: I10

## 2011-08-06 HISTORY — DX: Gout, unspecified: M10.9

## 2011-08-06 LAB — CBC WITH DIFFERENTIAL/PLATELET
Basophils Absolute: 0 10*3/uL (ref 0.0–0.1)
Basophils Relative: 0 % (ref 0–1)
Lymphocytes Relative: 35 % (ref 12–46)
MCHC: 34.2 g/dL (ref 30.0–36.0)
Neutro Abs: 2.4 10*3/uL (ref 1.7–7.7)
Neutrophils Relative %: 52 % (ref 43–77)
Platelets: 124 10*3/uL — ABNORMAL LOW (ref 150–400)
RDW: 14.5 % (ref 11.5–15.5)
WBC: 4.7 10*3/uL (ref 4.0–10.5)

## 2011-08-06 LAB — BASIC METABOLIC PANEL
BUN: 15 mg/dL (ref 6–23)
Chloride: 106 mEq/L (ref 96–112)
Creatinine, Ser: 1.59 mg/dL — ABNORMAL HIGH (ref 0.50–1.35)
GFR calc Af Amer: 47 mL/min — ABNORMAL LOW (ref >90)
GFR calc non Af Amer: 41 mL/min — ABNORMAL LOW (ref >90)

## 2011-08-06 LAB — POCT I-STAT TROPONIN I: Troponin i, poc: 0.01 ng/mL (ref 0.00–0.08)

## 2011-08-06 LAB — D-DIMER, QUANTITATIVE: D-Dimer, Quant: 0.52 ug/mL-FEU — ABNORMAL HIGH (ref 0.00–0.48)

## 2011-08-06 NOTE — ED Provider Notes (Signed)
History     CSN: [id number]  Arrival date & time 08/06/11  7829   First MD Initiated Contact with Patient 08/06/11 2255      Chief Complaint  Patient presents with  . Chest Pain    (Consider location/radiation/quality/duration/timing/severity/associated sxs/prior treatment) HPI HX per PT, on and off R sided CP. Located R lower ribs, no SOB, no aggravating or alleviating factors, lasts 2-3 hours, worse if he doesn't eat...no H/o GB problems or surgeries. Has a cardiologist but denies h/o CAD, arrythmia or MI.  He briefly wore a Holter monitor and believes everything turned out normal. Pain is Moderate in severity. Currently is pain-free. No Associated fever chills. No associated nausea vomiting or diarrhea. No past medical history on file.  No past surgical history on file.  No family history on file.  History  Substance Use Topics  . Smoking status: Not on file  . Smokeless tobacco: Not on file  . Alcohol Use: Not on file      Review of Systems  Constitutional: Negative for fever and chills.  HENT: Negative for neck pain and neck stiffness.   Eyes: Negative for pain.  Respiratory: Negative for shortness of breath.   Cardiovascular: Positive for chest pain.  Gastrointestinal: Negative for constipation, blood in stool and abdominal distention.  Genitourinary: Negative for dysuria.  Musculoskeletal: Negative for back pain.  Skin: Negative for rash.  Neurological: Negative for headaches.  All other systems reviewed and are negative.    Allergies  Aspirin; Other; and Prednisone  Home Medications   Current Outpatient Rx  Name Route Sig Dispense Refill  . ACETAMINOPHEN 325 MG PO TABS Oral Take 650 mg by mouth every 4 (four) hours as needed. For pain     . ASPIRIN EC 81 MG PO TBEC Oral Take 81 mg by mouth daily.      . ATENOLOL 50 MG PO TABS Oral Take 50 mg by mouth daily.      . ATORVASTATIN CALCIUM 40 MG PO TABS Oral Take 20 mg by mouth at bedtime.      Marland Kitchen CALCIUM  CARBONATE ANTACID 500 MG PO CHEW Oral Chew 1.5 tablets by mouth daily as needed. For binder     . CALCIUM CARBONATE-VITAMIN D 500-200 MG-UNIT PO TABS Oral Take 1 tablet by mouth daily.      . COLCHICINE 0.6 MG PO TABS Oral Take 0.6 mg by mouth daily.      Marland Kitchen FEXOFENADINE HCL 60 MG PO TABS Oral Take 60 mg by mouth daily as needed. For allergies     . ISOSORBIDE MONONITRATE ER 30 MG PO TB24 Oral Take 30 mg by mouth daily.      Carma Leaven M PLUS PO TABS Oral Take 1 tablet by mouth daily.      Marland Kitchen OMEPRAZOLE 20 MG PO CPDR Oral Take 20 mg by mouth daily.      Marland Kitchen TAMSULOSIN HCL 0.4 MG PO CAPS Oral Take 0.4 mg by mouth daily.        BP 156/95  Pulse 62  Temp 98.5 F (36.9 C) (Oral)  Resp 18  SpO2 98%  Physical Exam  Constitutional: He is oriented to person, place, and time. He appears well-developed and well-nourished.  HENT:  Head: Normocephalic and atraumatic.  Eyes: Conjunctivae and EOM are normal. Pupils are equal, round, and reactive to light.  Neck: Trachea normal. Neck supple. No thyromegaly present.  Cardiovascular: Normal rate, regular rhythm, S1 normal, S2 normal and normal pulses.  No systolic murmur is present   No diastolic murmur is present  Pulses:      Radial pulses are 2+ on the right side, and 2+ on the left side.  Pulmonary/Chest: Effort normal and breath sounds normal. He has no wheezes. He has no rhonchi. He has no rales. He exhibits no tenderness.  Abdominal: Soft. Normal appearance and bowel sounds are normal. He exhibits no mass. There is no tenderness. There is no rebound, no guarding, no CVA tenderness and negative Murphy's sign.  Musculoskeletal:       BLE:s Calves nontender, no cords or erythema, negative Homans sign  Neurological: He is alert and oriented to person, place, and time. He has normal strength. No cranial nerve deficit or sensory deficit. GCS eye subscore is 4. GCS verbal subscore is 5. GCS motor subscore is 6.  Skin: Skin is warm and dry. No rash  noted. He is not diaphoretic.  Psychiatric: His speech is normal.       Cooperative and appropriate    ED Course  Procedures (including critical care time)  Results for orders placed during the hospital encounter of 08/06/11  BASIC METABOLIC PANEL      Component Value Range   Sodium 141  135 - 145 mEq/L   Potassium 3.9  3.5 - 5.1 mEq/L   Chloride 106  96 - 112 mEq/L   CO2 24  19 - 32 mEq/L   Glucose, Bld 110 (*) 70 - 99 mg/dL   BUN 15  6 - 23 mg/dL   Creatinine, Ser 1.61 (*) 0.50 - 1.35 mg/dL   Calcium 9.0  8.4 - 09.6 mg/dL   GFR calc non Af Amer 41 (*) >90 mL/min   GFR calc Af Amer 47 (*) >90 mL/min  CBC WITH DIFFERENTIAL      Component Value Range   WBC 4.7  4.0 - 10.5 K/uL   RBC 3.72 (*) 4.22 - 5.81 MIL/uL   Hemoglobin 11.8 (*) 13.0 - 17.0 g/dL   HCT 04.5 (*) 40.9 - 81.1 %   MCV 92.7  78.0 - 100.0 fL   MCH 31.7  26.0 - 34.0 pg   MCHC 34.2  30.0 - 36.0 g/dL   RDW 91.4  78.2 - 95.6 %   Platelets 124 (*) 150 - 400 K/uL   Neutrophils Relative 52  43 - 77 %   Neutro Abs 2.4  1.7 - 7.7 K/uL   Lymphocytes Relative 35  12 - 46 %   Lymphs Abs 1.6  0.7 - 4.0 K/uL   Monocytes Relative 10  3 - 12 %   Monocytes Absolute 0.5  0.1 - 1.0 K/uL   Eosinophils Relative 3  0 - 5 %   Eosinophils Absolute 0.2  0.0 - 0.7 K/uL   Basophils Relative 0  0 - 1 %   Basophils Absolute 0.0  0.0 - 0.1 K/uL  POCT I-STAT TROPONIN I      Component Value Range   Troponin i, poc 0.01  0.00 - 0.08 ng/mL   Comment 3           D-DIMER, QUANTITATIVE      Component Value Range   D-Dimer, Quant 0.52 (*) 0.00 - 0.48 ug/mL-FEU   Dg Chest 2 View  08/06/2011  *RADIOLOGY REPORT*  Clinical Data: Right-sided chest pain and shortness of breath.  CHEST - 2 VIEW  Comparison: Two-view chest 11/18/2010.  Findings: The heart size is normal.  Fibrotic changes of the lungs are stable.  No acute or superimposed airspace disease is evident. The visualized soft tissues and bony thorax are unremarkable.  IMPRESSION: 1.   Stable fibrotic changes in the lower lobes bilaterally. 2.  No acute cardiopulmonary disease.  Original Report Authenticated By: Jamesetta Orleans. MATTERN, M.D.    Date: 08/06/2011  Rate: 71  Rhythm: normal sinus rhythm  QRS Axis: left  Intervals: normal  ST/T Wave abnormalities: nonspecific ST/T changes  Conduction Disutrbances:none  Narrative Interpretation:   Old EKG Reviewed: unchanged   Recheck at 1:05 AM remains pain-free.  Labs and imaging reviewed as above - for elevated d-dimer a CT scan was ordered. Noted mildly elevated creatinine.  4:15 AM d/w Dr Toniann Fail, will admit MED MDM  CP. Nursing notes reviewed. Old records reviewed. Labs and imaging EKG reviewed as above. No Medications provided as patient remains pain-free. MED admit.        Sunnie Nielsen, MD 08/07/11 [phone]

## 2011-08-06 NOTE — ED Notes (Signed)
Rt. Side cp. Radiating down to stomach. Feels like needles stabbing. Hx. GERD

## 2011-08-07 ENCOUNTER — Encounter (HOSPITAL_COMMUNITY): Payer: Self-pay | Admitting: Radiology

## 2011-08-07 ENCOUNTER — Emergency Department (HOSPITAL_COMMUNITY): Payer: Medicare Other

## 2011-08-07 DIAGNOSIS — E785 Hyperlipidemia, unspecified: Secondary | ICD-10-CM | POA: Diagnosis present

## 2011-08-07 DIAGNOSIS — N189 Chronic kidney disease, unspecified: Secondary | ICD-10-CM | POA: Diagnosis present

## 2011-08-07 DIAGNOSIS — M109 Gout, unspecified: Secondary | ICD-10-CM | POA: Diagnosis present

## 2011-08-07 DIAGNOSIS — R079 Chest pain, unspecified: Secondary | ICD-10-CM

## 2011-08-07 DIAGNOSIS — D649 Anemia, unspecified: Secondary | ICD-10-CM | POA: Diagnosis present

## 2011-08-07 DIAGNOSIS — I10 Essential (primary) hypertension: Secondary | ICD-10-CM | POA: Diagnosis present

## 2011-08-07 HISTORY — DX: Chronic kidney disease, unspecified: N18.9

## 2011-08-07 LAB — COMPREHENSIVE METABOLIC PANEL
AST: 44 U/L — ABNORMAL HIGH (ref 0–37)
CO2: 25 mEq/L (ref 19–32)
Chloride: 103 mEq/L (ref 96–112)
Creatinine, Ser: 1.43 mg/dL — ABNORMAL HIGH (ref 0.50–1.35)
GFR calc non Af Amer: 46 mL/min — ABNORMAL LOW (ref >90)
Total Bilirubin: 0.5 mg/dL (ref 0.3–1.2)

## 2011-08-07 LAB — CBC
MCH: 31.3 pg (ref 26.0–34.0)
MCHC: 33.7 g/dL (ref 30.0–36.0)
Platelets: 121 10*3/uL — ABNORMAL LOW (ref 150–400)
RBC: 3.83 MIL/uL — ABNORMAL LOW (ref 4.22–5.81)
RDW: 14.2 % (ref 11.5–15.5)

## 2011-08-07 LAB — CARDIAC PANEL(CRET KIN+CKTOT+MB+TROPI)
Relative Index: 1.3 (ref 0.0–2.5)
Relative Index: 1.3 (ref 0.0–2.5)
Relative Index: 1.1 (ref 0.0–2.5)
Total CK: 230 U/L (ref 7–232)
Total CK: 192 U/L (ref 7–232)
Troponin I: <0.3 ng/mL (ref <0.30)

## 2011-08-07 LAB — HEPATIC FUNCTION PANEL
ALT: 21 U/L (ref 0–53)
Bilirubin, Direct: 0.1 mg/dL (ref 0.0–0.3)
Indirect Bilirubin: 0.2 mg/dL — ABNORMAL LOW (ref 0.3–0.9)
Total Protein: 8.4 g/dL — ABNORMAL HIGH (ref 6.0–8.3)

## 2011-08-07 LAB — VITAMIN B12: Vitamin B-12: 442 pg/mL (ref 211–911)

## 2011-08-07 LAB — RETICULOCYTES
RBC.: 3.83 MIL/uL — ABNORMAL LOW (ref 4.22–5.81)
Retic Count, Absolute: 53.6 10*3/uL (ref 19.0–186.0)
Retic Ct Pct: 1.4 % (ref 0.4–3.1)

## 2011-08-07 LAB — TSH: TSH: 3.77 u[IU]/mL (ref 0.350–4.500)

## 2011-08-07 MED ORDER — IOHEXOL 350 MG/ML SOLN: 100.0000 mL | Freq: Once | INTRAVENOUS | Status: DC | PRN

## 2011-08-07 MED ORDER — ACETAMINOPHEN 325 MG PO TABS: 650.0000 mg | ORAL_TABLET | Freq: Four times a day (QID) | ORAL | Status: DC | PRN

## 2011-08-07 MED ORDER — ATORVASTATIN CALCIUM 20 MG PO TABS
20.0000 mg | ORAL_TABLET | Freq: Every day | ORAL | Status: DC
  Administered 2011-08-07: 20 mg via ORAL
  Filled 2011-08-07 (×2): qty 1

## 2011-08-07 MED ORDER — ISOSORBIDE MONONITRATE ER 30 MG PO TB24
30.0000 mg | ORAL_TABLET | Freq: Every morning | ORAL | Status: DC
  Administered 2011-08-07 – 2011-08-08 (×2): 30 mg via ORAL
  Filled 2011-08-07 (×2): qty 1

## 2011-08-07 MED ORDER — TAMSULOSIN HCL 0.4 MG PO CAPS
0.8000 mg | ORAL_CAPSULE | Freq: Every day | ORAL | Status: DC
  Administered 2011-08-07: 0.8 mg via ORAL
  Filled 2011-08-07 (×2): qty 2

## 2011-08-07 MED ORDER — PANTOPRAZOLE SODIUM 40 MG PO TBEC
40.0000 mg | DELAYED_RELEASE_TABLET | Freq: Every day | ORAL | Status: DC
  Administered 2011-08-07 – 2011-08-08 (×2): 40 mg via ORAL
  Filled 2011-08-07 (×2): qty 1

## 2011-08-07 MED ORDER — SODIUM CHLORIDE 0.9 % IV SOLN
INTRAVENOUS | Status: AC
  Administered 2011-08-07: 11:00:00 via INTRAVENOUS

## 2011-08-07 MED ORDER — PANTOPRAZOLE SODIUM 40 MG IV SOLR
40.0000 mg | Freq: Once | INTRAVENOUS | Status: AC
  Administered 2011-08-07: 40 mg via INTRAVENOUS
  Filled 2011-08-07: qty 40

## 2011-08-07 MED ORDER — ACETAMINOPHEN 650 MG RE SUPP: 650.0000 mg | Freq: Four times a day (QID) | RECTAL | Status: DC | PRN

## 2011-08-07 MED ORDER — SODIUM CHLORIDE 0.9 % IJ SOLN
3.0000 mL | Freq: Two times a day (BID) | INTRAMUSCULAR | Status: DC
  Administered 2011-08-07 – 2011-08-08 (×2): 3 mL via INTRAVENOUS

## 2011-08-07 MED ORDER — ASPIRIN EC 325 MG PO TBEC
325.0000 mg | DELAYED_RELEASE_TABLET | Freq: Every day | ORAL | Status: DC
  Administered 2011-08-07 – 2011-08-08 (×2): 325 mg via ORAL
  Filled 2011-08-07 (×2): qty 1

## 2011-08-07 MED ORDER — ONDANSETRON HCL 4 MG/2ML IJ SOLN: 4.0000 mg | Freq: Four times a day (QID) | INTRAMUSCULAR | Status: DC | PRN

## 2011-08-07 MED ORDER — COLCHICINE 0.6 MG PO TABS
0.6000 mg | ORAL_TABLET | Freq: Every day | ORAL | Status: DC
  Administered 2011-08-07: 0.6 mg via ORAL
  Filled 2011-08-07 (×2): qty 1

## 2011-08-07 MED ORDER — FLUTICASONE PROPIONATE 50 MCG/ACT NA SUSP
1.0000 | Freq: Every day | NASAL | Status: DC
  Administered 2011-08-07 – 2011-08-08 (×2): 1 via NASAL
  Filled 2011-08-07: qty 16

## 2011-08-07 MED ORDER — ATENOLOL 25 MG PO TABS
25.0000 mg | ORAL_TABLET | Freq: Every morning | ORAL | Status: DC
  Administered 2011-08-07 – 2011-08-08 (×2): 25 mg via ORAL
  Filled 2011-08-07 (×2): qty 1

## 2011-08-07 MED ORDER — ONDANSETRON HCL 4 MG PO TABS: 4.0000 mg | ORAL_TABLET | Freq: Four times a day (QID) | ORAL | Status: DC | PRN

## 2011-08-07 NOTE — ED Notes (Signed)
Patient transported to CT 

## 2011-08-07 NOTE — H&P (Signed)
James Pope is an 78 y.o. male.   PCP - Dr.Preston Chestine Spore. Chief Complaint: Chest pain. HPI: 78 year-old male with known history of nonobstructive CAD per cardiac catheter done in March 2012 presents with complaints of chest pain. Patient states he has been having chest pain over the last one week like a pin pricking sensation over the chest wall. Since the symptoms have been persistent he decided to come to the ER. In the ER cardiac enzymes and EKG were negative CT angiogram of the chest was done due to elevated d-dimer which does not show any PE. At this time patient is chest pain-free and will be admitted for rule out ACS. Patient denies any nausea vomiting. Patient's states over the last 2 days he has been having more pain on the right lower part of his chest. Denies any assisted shortness of breath fever chills or any productive cough.  Past Medical History  Diagnosis Date  . Hypertension   . Gout   . Hyperlipidemia     Past Surgical History  Procedure Date  . Cardiac catheterization     Family History  Problem Relation Age of Onset  . Diabetes type II Father    Social History:  reports that he has quit smoking. He does not have any smokeless tobacco history on file. He reports that he does not drink alcohol or use illicit drugs.  Allergies:  Allergies  Allergen Reactions  . Other Other (See Comments)    Medication-NapraPAC (prevacid and naprosyn) Reaction-Hiccups  . Prednisone Other (See Comments)    Hiccups    Medications Prior to Admission  Medication Sig Dispense Refill  . aspirin EC 81 MG tablet Take 81 mg by mouth every morning.       Marland Kitchen atenolol (TENORMIN) 25 MG tablet Take 25 mg by mouth every morning.      Marland Kitchen atorvastatin (LIPITOR) 20 MG tablet Take 20 mg by mouth at bedtime.      . calcium carbonate (TUMS - DOSED IN MG ELEMENTAL CALCIUM) 500 MG chewable tablet Chew 2 tablets by mouth 3 (three) times daily as needed. For heartburn      . Calcium  Carbonate-Vitamin D (CALCIUM 600 + D PO) Take 1 tablet by mouth at bedtime.      . colchicine 0.6 MG tablet Take 0.6 mg by mouth at bedtime.       . fluticasone (FLONASE) 50 MCG/ACT nasal spray Place 1 spray into the nose 2 (two) times daily as needed. For nasal congestion      . isosorbide mononitrate (IMDUR) 30 MG 24 hr tablet Take 30 mg by mouth every morning.       . Multiple Vitamins-Minerals (MULTIVITAMINS THER. W/MINERALS) TABS Take 1 tablet by mouth every morning.       Marland Kitchen omeprazole (PRILOSEC) 20 MG capsule Take 20 mg by mouth every morning.       . simethicone (MYLICON) 125 MG chewable tablet Chew 125 mg by mouth every 6 (six) hours as needed. For flatulence      . Tamsulosin HCl (FLOMAX) 0.4 MG CAPS Take 0.8 mg by mouth at bedtime.         Results for orders placed during the hospital encounter of 08/06/11 (from the past 48 hour(s))  BASIC METABOLIC PANEL     Status: Abnormal   Collection Time   08/06/11  7:06 PM      Component Value Range Comment   Sodium 141  135 - 145 mEq/L  Potassium 3.9  3.5 - 5.1 mEq/L    Chloride 106  96 - 112 mEq/L    CO2 24  19 - 32 mEq/L    Glucose, Bld 110 (*) 70 - 99 mg/dL    BUN 15  6 - 23 mg/dL    Creatinine, Ser 3.24 (*) 0.50 - 1.35 mg/dL    Calcium 9.0  8.4 - 40.1 mg/dL    GFR calc non Af Amer 41 (*) >90 mL/min    GFR calc Af Amer 47 (*) >90 mL/min   CBC WITH DIFFERENTIAL     Status: Abnormal   Collection Time   08/06/11  7:06 PM      Component Value Range Comment   WBC 4.7  4.0 - 10.5 K/uL    RBC 3.72 (*) 4.22 - 5.81 MIL/uL    Hemoglobin 11.8 (*) 13.0 - 17.0 g/dL    HCT 02.7 (*) 25.3 - 52.0 %    MCV 92.7  78.0 - 100.0 fL    MCH 31.7  26.0 - 34.0 pg    MCHC 34.2  30.0 - 36.0 g/dL    RDW 66.4  40.3 - 47.4 %    Platelets 124 (*) 150 - 400 K/uL    Neutrophils Relative 52  43 - 77 %    Neutro Abs 2.4  1.7 - 7.7 K/uL    Lymphocytes Relative 35  12 - 46 %    Lymphs Abs 1.6  0.7 - 4.0 K/uL    Monocytes Relative 10  3 - 12 %    Monocytes  Absolute 0.5  0.1 - 1.0 K/uL    Eosinophils Relative 3  0 - 5 %    Eosinophils Absolute 0.2  0.0 - 0.7 K/uL    Basophils Relative 0  0 - 1 %    Basophils Absolute 0.0  0.0 - 0.1 K/uL   POCT I-STAT TROPONIN I     Status: Normal   Collection Time   08/06/11  7:23 PM      Component Value Range Comment   Troponin i, poc 0.01  0.00 - 0.08 ng/mL    Comment 3            D-DIMER, QUANTITATIVE     Status: Abnormal   Collection Time   08/06/11 11:04 PM      Component Value Range Comment   D-Dimer, Quant 0.52 (*) 0.00 - 0.48 ug/mL-FEU   HEPATIC FUNCTION PANEL     Status: Abnormal   Collection Time   08/06/11 11:04 PM      Component Value Range Comment   Total Protein 8.4 (*) 6.0 - 8.3 g/dL    Albumin 3.7  3.5 - 5.2 g/dL    AST 48 (*) 0 - 37 U/L    ALT 21  0 - 53 U/L    Alkaline Phosphatase 82  39 - 117 U/L    Total Bilirubin 0.3  0.3 - 1.2 mg/dL    Bilirubin, Direct 0.1  0.0 - 0.3 mg/dL    Indirect Bilirubin 0.2 (*) 0.3 - 0.9 mg/dL   POCT I-STAT TROPONIN I     Status: Normal   Collection Time   08/07/11  3:44 AM      Component Value Range Comment   Troponin i, poc 0.00  0.00 - 0.08 ng/mL    Comment 3             Dg Chest 2 View  08/06/2011  *RADIOLOGY REPORT*  Clinical Data:  Right-sided chest pain and shortness of breath.  CHEST - 2 VIEW  Comparison: Two-view chest 11/18/2010.  Findings: The heart size is normal.  Fibrotic changes of the lungs are stable.  No acute or superimposed airspace disease is evident. The visualized soft tissues and bony thorax are unremarkable.  IMPRESSION: 1.  Stable fibrotic changes in the lower lobes bilaterally. 2.  No acute cardiopulmonary disease.  Original Report Authenticated By: Jamesetta Orleans. MATTERN, M.D.   Ct Angio Chest W/cm &/or Wo Cm  08/07/2011  *RADIOLOGY REPORT*  Clinical Data: Right-sided chest pain radiating down to the stomach.  CT ANGIOGRAPHY CHEST  Technique:  Multidetector CT imaging of the chest using the standard protocol during bolus  administration of intravenous contrast. Multiplanar reconstructed images including MIPs were obtained and reviewed to evaluate the vascular anatomy.  Contrast:  100 ml Omnipaque 300  Comparison: 11/19/2010  Findings: Technically adequate study with good opacification of the central and segmental pulmonary arteries.  No focal filling defects are demonstrated.  No evidence of significant pulmonary embolus. Mild cardiac enlargement.  Coronary artery calcification. Calcification of the aorta.  No aneurysm.  Mediastinal and hilar lymph nodes are not pathologically enlarged.  The esophagus is not distended but there is suggestion of diffuse esophageal wall thickening.  This could represent under distension versus inflammatory process.  Visualized portions of the upper abdominal organs are grossly unremarkable.  There are diffuse emphysematous changes and diffuse fibrosis throughout the lungs.  This appears to demonstrate some progression since the previous study although there was motion artifact on the last study which limits the evaluation.  Airways appear patent.  No pleural effusion.  No pneumothorax.  Degenerative changes in the lumbar spine.  IMPRESSION: No evidence of significant pulmonary embolus.  Prominent diffuse emphysematous changes and fibrosis in the lungs.  Nonspecific appearance of the esophagus suggesting wall thickening versus under distension.  Consider esophagitis.  Original Report Authenticated By: Marlon Pel, M.D.    Review of Systems  Constitutional: Negative.   HENT: Negative.   Eyes: Negative.   Respiratory: Negative.   Cardiovascular: Positive for chest pain.  Gastrointestinal: Negative.   Genitourinary: Negative.   Musculoskeletal: Negative.   Skin: Negative.   Neurological: Negative.   Endo/Heme/Allergies: Negative.   Psychiatric/Behavioral: Negative.     Blood pressure 160/88, pulse 57, temperature 97.5 F (36.4 C), temperature source Oral, resp. rate 16, height 5'  11" (1.803 m), weight 84.5 kg (186 lb 4.6 oz), SpO2 97.00%. Physical Exam  Constitutional: He is oriented to person, place, and time. He appears well-developed and well-nourished. No distress.  HENT:  Head: Normocephalic and atraumatic.  Right Ear: External ear normal.  Left Ear: External ear normal.  Nose: Nose normal.  Mouth/Throat: Oropharynx is clear and moist. No oropharyngeal exudate.  Eyes: Conjunctivae are normal. Pupils are equal, round, and reactive to light. Right eye exhibits no discharge. Left eye exhibits no discharge. No scleral icterus.  Neck: Normal range of motion. Neck supple.  Cardiovascular: Normal rate and regular rhythm.   Respiratory: Effort normal and breath sounds normal. No respiratory distress. He has no wheezes. He has no rales.  GI: Soft. Bowel sounds are normal. He exhibits no distension. There is no tenderness. There is no rebound.  Musculoskeletal: Normal range of motion. He exhibits no edema and no tenderness.  Neurological: He is alert and oriented to person, place, and time.       Moves all extremities.  Skin: Skin is warm and dry. He is not  diaphoretic.  Psychiatric: His behavior is normal.     Assessment/Plan #1. Atypical chest pain with history of nonobstructive CAD on cardiac catheter last year in March 2012 - cycle cardiac markers. Continue aspirin. On Imdur.  #2. Anemia, normocytic normochromic - patient states he has not had an EGD or colonoscopy. Patient's CT chest does show possibility of esophagitis. At this time I'm checking anemia panel. I have advised patient that he'll need further workup through his primary care for anemia including EGD and colonoscopy. Closely follow CBC.  #3. Mild thrombocytopenia - follow CBC. #4. Hypertension - continue present medications. #5. Possible CKD. #6. History of gout.  CODE STATUS - full code.  KAKRAKANDY,ARSHAD N. 08/07/2011, 5:54 AM

## 2011-08-07 NOTE — ED Notes (Signed)
Pt from CT

## 2011-08-07 NOTE — Progress Notes (Signed)
   Patient admitted this morning. Seen and examined. H&P reviewed.  Patient denies any chest pain currently. It was a  Right sided pain. Says he usually gets such pain with 'gas'. Tried taking Gasx with no relief. Takes Prilosec for GERD. Does not know of any gallbladder problems.  Vitals reviewed. BP was high this morning.  Lungs are CTA Heart: S1S2 normal, reg, no rubs, murmurs, bruits Abdomen: Soft, no RUQ tenderness Neuro: Alert and oriented with no focal deficits  EKG was unchanged from before.  Continue rule out protocol. Pain is atypical for cardiac etiology. Ct was neg for PE. Would recommend working up GB as an etiology as OP. LFT's are unremarkable. Follow BP.  Likely home in Am if he rules out.  James Pope 1:07 PM

## 2011-08-07 NOTE — Progress Notes (Signed)
UR Completed.  James Pope [phone] 08/07/2011

## 2011-08-08 DIAGNOSIS — I10 Essential (primary) hypertension: Secondary | ICD-10-CM

## 2011-08-08 DIAGNOSIS — R079 Chest pain, unspecified: Secondary | ICD-10-CM

## 2011-08-08 DIAGNOSIS — N189 Chronic kidney disease, unspecified: Secondary | ICD-10-CM

## 2011-08-08 DIAGNOSIS — D649 Anemia, unspecified: Secondary | ICD-10-CM

## 2011-08-08 NOTE — Discharge Summary (Signed)
Physician Discharge Summary  Patient ID: James Pope MRN: [id number] DOB/AGE: 21-Mar-1932 78 y.o.  Admit date: 08/06/2011 Discharge date: 08/08/2011  PCP: Laurena Slimmer, MD  DISCHARGE DIAGNOSES:  Principal Problem:  *Chest pain Active Problems:  HTN (hypertension)  CKD (chronic kidney disease)  Hyperlipidemia  Anemia  Gout   RECOMMENDATIONS TO PCP: 1. Patient symptoms are suggestive of a biliary process. Please consider work up for same. 2. Anemia work up  DISCHARGE CONDITION: fair  INITIAL HISTORY: 78 year-old male with known history of nonobstructive CAD per cardiac catheterization done in March 2012 presented with complaints of chest pain. Patient stated he had been having chest pain over the last one week like a pin pricking sensation over the chest wall. Since the symptoms had been persistent he decided to come to the ER. In the ER cardiac enzymes and EKG were negative. CT angiogram of the chest was done due to elevated d-dimer which did not show any PE. At the time of admission patient was chest pain-free and was be admitted for rule out ACS. Patient denied any nausea vomiting. Patient's stated over the last 2 days he has been having more pain on the right lower part of his chest. Denied any shortness of breath fever chills or any productive cough. He did mention that the pain was associated with belching and 'gas'.   HOSPITAL COURSE:   #1. Atypical chest pain with history of nonobstructive CAD on cardiac catheterization last year in March 2012: Patient remained CP free. He ruled out for ACS by serial cardiac markers. His pain is felt to be non-cardiac. It could be from a biliary process. LFT's in the hospital were unremarkable. Patient did not have RUQ tenderness. He was asked to follow up with his PCP for further evaluation. He was also asked to call his cardiologist.  Continue current home medications with no changes. He states he takes Prilosec at home.  #2.  Anemia, normocytic normochromic - patient states he has not had an EGD or colonoscopy. Patient's CT chest does show possibility of esophagitis. Anemia panel did not show iron deficiency. Patient advised that he'll need further workup through his primary care for anemia. There was no overt bleeding.  #3. Mild thrombocytopenia: remained stable. Etiology unclear.   #4. Hypertension: BP remained stable.  #5. Possible CKD. Creatinine was stable. Patient was making urine. PCP to follow.  #6. History of gout: Stable  Overall patient remained stable. He is CP free. Is stable for discharge.   PERTINENT LABS: Ferritin 450, Iron 183, B12 442, TSH 3.7, Creat 1.43. Hgb 12.0, MCV 93   IMAGING STUDIES Dg Chest 2 View  08/06/2011  *RADIOLOGY REPORT*  Clinical Data: Right-sided chest pain and shortness of breath.  CHEST - 2 VIEW  Comparison: Two-view chest 11/18/2010.  Findings: The heart size is normal.  Fibrotic changes of the lungs are stable.  No acute or superimposed airspace disease is evident. The visualized soft tissues and bony thorax are unremarkable.  IMPRESSION: 1.  Stable fibrotic changes in the lower lobes bilaterally. 2.  No acute cardiopulmonary disease.  Original Report Authenticated By: Jamesetta Orleans. MATTERN, M.D.   Ct Angio Chest W/cm &/or Wo Cm  08/07/2011  *RADIOLOGY REPORT*  Clinical Data: Right-sided chest pain radiating down to the stomach.  CT ANGIOGRAPHY CHEST  Technique:  Multidetector CT imaging of the chest using the standard protocol during bolus administration of intravenous contrast. Multiplanar reconstructed images including MIPs were obtained and reviewed to evaluate the vascular  anatomy.  Contrast:  100 ml Omnipaque 300  Comparison: 11/19/2010  Findings: Technically adequate study with good opacification of the central and segmental pulmonary arteries.  No focal filling defects are demonstrated.  No evidence of significant pulmonary embolus. Mild cardiac enlargement.   Coronary artery calcification. Calcification of the aorta.  No aneurysm.  Mediastinal and hilar lymph nodes are not pathologically enlarged.  The esophagus is not distended but there is suggestion of diffuse esophageal wall thickening.  This could represent under distension versus inflammatory process.  Visualized portions of the upper abdominal organs are grossly unremarkable.  There are diffuse emphysematous changes and diffuse fibrosis throughout the lungs.  This appears to demonstrate some progression since the previous study although there was motion artifact on the last study which limits the evaluation.  Airways appear patent.  No pleural effusion.  No pneumothorax.  Degenerative changes in the lumbar spine.  IMPRESSION: No evidence of significant pulmonary embolus.  Prominent diffuse emphysematous changes and fibrosis in the lungs.  Nonspecific appearance of the esophagus suggesting wall thickening versus under distension.  Consider esophagitis.  Original Report Authenticated By: Marlon Pel, M.D.    DISCHARGE EXAMINATION: Blood pressure 133/74, pulse 55, temperature 98.7 F (37.1 C), temperature source Oral, resp. rate 18, height 5\' 11"  (1.803 m), weight 84.5 kg (186 lb 4.6 oz), SpO2 91.00%. General appearance: alert, cooperative, appears stated age and no distress Resp: clear to auscultation bilaterally Cardio: regular rate and rhythm, S1, S2 normal, no murmur, click, rub or gallop GI: soft, non-tender; bowel sounds normal; no masses,  no organomegaly Extremities: extremities normal, atraumatic, no cyanosis or edema  DISPOSITION: Home  Discharge Orders    Future Orders Please Complete By Expires   Diet - low sodium heart healthy      Increase activity slowly      Discharge instructions      Comments:   Be sure to call Dr. [name] on Monday Talk to your doctor about checking you for gallbladder problems     Current Discharge Medication List    CONTINUE these medications which  have NOT CHANGED   Details  aspirin EC 81 MG tablet Take 81 mg by mouth every morning.     atenolol (TENORMIN) 25 MG tablet Take 25 mg by mouth every morning.    atorvastatin (LIPITOR) 20 MG tablet Take 20 mg by mouth at bedtime.    calcium carbonate (TUMS - DOSED IN MG ELEMENTAL CALCIUM) 500 MG chewable tablet Chew 2 tablets by mouth 3 (three) times daily as needed. For heartburn    Calcium Carbonate-Vitamin D (CALCIUM 600 + D PO) Take 1 tablet by mouth at bedtime.    colchicine 0.6 MG tablet Take 0.6 mg by mouth at bedtime.     fluticasone (FLONASE) 50 MCG/ACT nasal spray Place 1 spray into the nose 2 (two) times daily as needed. For nasal congestion    isosorbide mononitrate (IMDUR) 30 MG 24 hr tablet Take 30 mg by mouth every morning.     Multiple Vitamins-Minerals (MULTIVITAMINS THER. W/MINERALS) TABS Take 1 tablet by mouth every morning.     omeprazole (PRILOSEC) 20 MG capsule Take 20 mg by mouth every morning.     simethicone (MYLICON) 125 MG chewable tablet Chew 125 mg by mouth every 6 (six) hours as needed. For flatulence    Tamsulosin HCl (FLOMAX) 0.4 MG CAPS Take 0.8 mg by mouth at bedtime.        Follow-up Information    Follow up with  Laurena Slimmer, MD. Schedule an appointment as soon as possible for a visit in 1 week.   Contact information:   [address], [address] [place] [address] [phone]       Follow up with Lennette Bihari, MD. Call in 2 days. (for an appointment)    Contact information:   [address] [place] [address] [phone]          TOTAL DISCHARGE TIME: 35 mins  [facility]  [facility] Pager [phone]  08/08/2011, 12:40 PM

## 2011-08-10 ENCOUNTER — Other Ambulatory Visit: Payer: Self-pay | Admitting: Internal Medicine

## 2011-08-10 DIAGNOSIS — K8020 Calculus of gallbladder without cholecystitis without obstruction: Secondary | ICD-10-CM

## 2011-08-12 ENCOUNTER — Ambulatory Visit (HOSPITAL_COMMUNITY)
Admission: RE | Admit: 2011-08-12 | Discharge: 2011-08-12 | Disposition: A | Discharge status: Home / Self Care | Payer: Medicare Other | Source: Ambulatory Visit | Attending: Internal Medicine | Admitting: Internal Medicine

## 2011-08-12 DIAGNOSIS — K7689 Other specified diseases of liver: Secondary | ICD-10-CM | POA: Insufficient documentation

## 2011-08-12 DIAGNOSIS — K8020 Calculus of gallbladder without cholecystitis without obstruction: Secondary | ICD-10-CM

## 2011-11-18 ENCOUNTER — Other Ambulatory Visit (HOSPITAL_COMMUNITY): Payer: Self-pay | Admitting: Internal Medicine

## 2011-11-18 DIAGNOSIS — R0602 Shortness of breath: Secondary | ICD-10-CM

## 2011-11-24 ENCOUNTER — Ambulatory Visit (HOSPITAL_COMMUNITY)
Admission: RE | Admit: 2011-11-24 | Discharge: 2011-11-24 | Disposition: A | Discharge status: Home / Self Care | Payer: Medicare Other | Source: Ambulatory Visit | Attending: Internal Medicine | Admitting: Internal Medicine

## 2011-11-24 DIAGNOSIS — R0602 Shortness of breath: Secondary | ICD-10-CM | POA: Insufficient documentation

## 2011-11-24 LAB — PULMONARY FUNCTION TEST

## 2011-11-24 MED ORDER — ALBUTEROL SULFATE (5 MG/ML) 0.5% IN NEBU
2.5000 mg | INHALATION_SOLUTION | Freq: Once | RESPIRATORY_TRACT | Status: AC
  Administered 2011-11-24: 2.5 mg via RESPIRATORY_TRACT

## 2011-12-23 ENCOUNTER — Encounter: Payer: Self-pay | Admitting: Internal Medicine

## 2011-12-23 ENCOUNTER — Ambulatory Visit (INDEPENDENT_AMBULATORY_CARE_PROVIDER_SITE_OTHER): Payer: Medicare Other | Admitting: Internal Medicine

## 2011-12-23 ENCOUNTER — Other Ambulatory Visit (INDEPENDENT_AMBULATORY_CARE_PROVIDER_SITE_OTHER): Payer: Medicare Other

## 2011-12-23 VITALS — BP 108/68 | HR 82 | Temp 97.6°F | Ht 71.0 in | Wt 194.2 lb

## 2011-12-23 DIAGNOSIS — J8410 Pulmonary fibrosis, unspecified: Secondary | ICD-10-CM

## 2011-12-23 HISTORY — DX: Pulmonary fibrosis, unspecified: J84.10

## 2011-12-23 LAB — CBC WITH DIFFERENTIAL/PLATELET
Basophils Absolute: 0 10*3/uL (ref 0.0–0.1)
Basophils Relative: 0.3 % (ref 0.0–3.0)
Eosinophils Absolute: 0.2 10*3/uL (ref 0.0–0.7)
HCT: 38.1 % — ABNORMAL LOW (ref 39.0–52.0)
Hemoglobin: 12.6 g/dL — ABNORMAL LOW (ref 13.0–17.0)
Lymphs Abs: 1 10*3/uL (ref 0.7–4.0)
MCHC: 33.1 g/dL (ref 30.0–36.0)
Neutro Abs: 3.9 10*3/uL (ref 1.4–7.7)
RDW: 14.1 % (ref 11.5–14.6)

## 2011-12-23 MED ORDER — FAMOTIDINE 20 MG PO TABS: ORAL_TABLET | ORAL | Status: DC

## 2011-12-23 MED ORDER — PANTOPRAZOLE SODIUM 40 MG PO TBEC: 40.0000 mg | DELAYED_RELEASE_TABLET | Freq: Every day | ORAL | Status: DC

## 2011-12-23 NOTE — Progress Notes (Signed)
  Subjective:    Patient ID: James Pope, male    DOB: 18-Nov-1932 MRN: [id number]  HPI  60 yobm quit smoking 2000 no respiratory sequelae referred 12/23/2011  by Dr [name] for sob and cough.   12/23/2011 1st pulmonary eval cc indolent onset x one year gradually worse doe 1st with gardening (also knees gave) to point where can no longer do yard work,  Also problem with talking assoc with voice change, cough mostly dry and daytime, not related to meals- tried flovent no better. No h/o RA or unusual exposures.  Sleeping ok without nocturnal  or early am exacerbation  of respiratory  c/o's or need for noct saba. Also denies any obvious fluctuation of symptoms with weather or environmental changes or other aggravating or alleviating factors except as outlined above    Review of Systems  Constitutional: Negative for fever, chills, activity change, appetite change and unexpected weight change.  HENT: Positive for congestion, sore throat, sneezing and trouble swallowing. Negative for rhinorrhea, dental problem, voice change and postnasal drip.   Eyes: Negative for visual disturbance.  Respiratory: Positive for cough and shortness of breath. Negative for choking.   Cardiovascular: Positive for chest pain. Negative for leg swelling.  Gastrointestinal: Negative for nausea, vomiting and abdominal pain.  Genitourinary: Negative for difficulty urinating.  Musculoskeletal: Positive for arthralgias.  Skin: Negative for rash.  Psychiatric/Behavioral: Negative for behavioral problems and confusion.       Objective:   Physical Exam  Very hoarse amb wm nad Full set of dentures Gen: No acute distress. Well nourished and well groomed.  Neurological: Alert and oriented to person, place, and time. Coordination normal.  Head: Normocephalic and atraumatic.  Eyes: Conjunctivae are normal. Pupils are equal, round, and reactive to light. No scleral icterus.  Neck: Normal range of motion. Neck  supple. No tracheal deviation or thyromegaly present. No cervical lymphadenopathy.  Cardiovascular: Normal rate, regular rhythm, normal heart sounds and intact distal pulses. Exam reveals no gallop and no friction rub. No murmur heard.  Respiratory: Effort normal. No respiratory distress. No chest wall tenderness. Breath sounds normal. Very minimal insp crackles  GI: Soft. Bowel sounds are normal. The abdomen is soft and nontender. There is no rebound and no guarding.  Musculoskeletal: Normal range of motion. Extremities are nontender.  Skin: Skin is warm and dry. No rash noted. No diaphoresis. No erythema. No pallor. No clubbing, cyanosis, or edema.  Psychiatric: Normal mood and affect. Behavior is normal. Judgment and thought content normal.      CY chest 08/06/11 No evidence of significant pulmonary embolus. Prominent diffuse  emphysematous changes and fibrosis in the lungs. Nonspecific  appearance of the esophagus suggesting wall thickening versus under  distension. Consider esophagitis.      Assessment & Plan:

## 2011-12-23 NOTE — Patient Instructions (Addendum)
Try pantoprazole 40 mg  Take 30-60 min before first meal of the day and Pepcid 20 mg one bedtime until cough is completely gone for at least a week without the need for cough suppression  I think of reflux for chronic cough like I do oxygen for fire (doesn't cause the fire but once you get the oxygen suppressed it usually goes away regardless of the exact cause).   GERD (REFLUX)  is an extremely common cause of respiratory symptoms, many times with no significant heartburn at all.    It can be treated with medication, but also with lifestyle changes including avoidance of late meals, excessive alcohol, smoking cessation, and avoid fatty foods, chocolate, peppermint, colas, red wine, and acidic juices such as orange juice.  NO MINT OR MENTHOL PRODUCTS SO NO COUGH DROPS  USE SUGARLESS CANDY INSTEAD (jolley ranchers or Stover's)  NO OIL BASED VITAMINS - use powdered substitutes.  Please remember to go to the lab  department downstairs for your tests - we will call you with the results when they are available.     Please schedule a follow up office visit in 4 weeks, sooner if needed

## 2011-12-24 NOTE — Assessment & Plan Note (Addendum)
PFT's 11/24/11  VC 2.94 and dllc 29% - 12/23/2011  Walked RA x 2 laps @ 185 ft each stopped due to  desat to 86%  DDx for pulmonary fibrosis  includes idiopathic pulmonary fibrosis, pulmonary fibrosis associated with rheumatologic diseases (which have a relatively benign course in most cases) , adverse effect from  drugs such as chemotherapy or amiodarone exposure, pneumoconiosis,  nonspecific interstitial pneumonia which is typically steroid responsive, and chronic hypersensitivity pneumonitis.   In active  smokers Langerhan's Cell  Histiocyctosis (eosinophilic granuomatosis),  DIP,  and Respiratory Bronchiolitis ILD also need to be considered,    Use of PPI is associated with improved survival time and with decreased radiologic fibrosis per King's study published in AJRCCM vol 184 p1390.  Dec 2011  This may not be cause and effect, but given how universally unhelpful all the otherstudy drugs have been for pf,   rec start  Max acid suppression / diet/ lifestyle modification.    Based on esr 72 may have some form of connective tissue dz related or inflammatory ILD so will ask him to return in 4 weeks for second "point on the curve" then consider steroid trial.

## 2012-01-14 ENCOUNTER — Ambulatory Visit (INDEPENDENT_AMBULATORY_CARE_PROVIDER_SITE_OTHER): Payer: Medicare Other | Admitting: Internal Medicine

## 2012-01-14 ENCOUNTER — Encounter: Payer: Self-pay | Admitting: Internal Medicine

## 2012-01-14 VITALS — BP 138/82 | HR 58 | Temp 97.3°F | Ht 71.0 in | Wt 197.4 lb

## 2012-01-14 DIAGNOSIS — J8410 Pulmonary fibrosis, unspecified: Secondary | ICD-10-CM

## 2012-01-14 MED ORDER — METHYLPREDNISOLONE 16 MG PO TABS: 16.0000 mg | ORAL_TABLET | Freq: Every day | ORAL | Status: DC

## 2012-01-14 NOTE — Assessment & Plan Note (Signed)
-   First suggested on cxr 12/2004 but clinically limiting activity since 2012 - PFT's 11/24/11  VC 2.94 and dllc 29% - 12/23/2011  Walked RA x 2 laps @ 185 ft each stopped due to  desat to 86% -01/14/2012  Walked RA  2 laps @ 185 ft each stopped due to  desat to 87%  - ESR 72 12/23/11  > started steroids 01/14/2012  I had an extended discussion with the patient today lasting 15 to 20 minutes of a 25 minute visit on the following issues:   Discussed in detail all the  indications, usual  risks and alternatives  relative to the benefits with patient who agrees to proceed with trial of steroids working to the lowest effective dose by self titration following symptoms of cough, sob, and serial walks/ esr here in office  See instructions for specific recommendations which were reviewed directly with the patient who was given a copy with highlighter outlining the key components.

## 2012-01-14 NOTE — Patient Instructions (Signed)
Stay on pantoprazole and pepcid as before plus observe the GERD diet as best you can  Medrol 16 mg 2 daily until coughing and breathing are better then reduce the dose to one pill daily taken at breakfast  Please schedule a follow up office visit in 4 weeks, sooner if needed

## 2012-01-14 NOTE — Progress Notes (Signed)
  Subjective:    Patient ID: James Pope, male    DOB: 20-May-1932   MRN: [id number]  HPI  54 yobm quit smoking 2000 no respiratory sequelae referred 12/23/2011  by Dr [name] for sob and cough.   12/23/2011 1st pulmonary eval cc indolent onset x one year gradually worse doe 1st with gardening (also knees gave) to point where can no longer do yard work,  Also problem with talking assoc with voice change, cough mostly dry and daytime, not related to meals- tried flovent no better. No h/o RA or unusual exposures. rec Try pantoprazole 40 mg  Take 30-60 min before first meal of the day and Pepcid 20 mg one bedtime until cough   GERD diet  D/c flovent   01/14/2012 f/u ov/Pete Schnitzer cc no better at all x perhaps a bit less cough. No obvious daytime variabilty or assoc  cp or chest tightness, subjective wheeze overt sinus or hb symptoms. No unusual exp hx   Sleeping ok without nocturnal  or early am exacerbation  of respiratory  c/o's or need for noct saba. Also denies any obvious fluctuation of symptoms with weather or environmental changes or other aggravating or alleviating factors except as outlined above   ROS  The following are not active complaints unless bolded sore throat, dysphagia, dental problems, itching, sneezing,  nasal congestion or excess/ purulent secretions, ear ache,   fever, chills, sweats, unintended wt loss, pleuritic or exertional cp, hemoptysis,  orthopnea pnd or leg swelling, presyncope, palpitations, heartburn, abdominal pain, anorexia, nausea, vomiting, diarrhea  or change in bowel or urinary habits, change in stools or urine, dysuria,hematuria,  rash, arthralgias mostly knees, visual complaints, headache, numbness weakness or ataxia or problems with walking or coordination,  change in mood/affect or memory.             Objective:   Physical Exam Wt Readings from Last 3 Encounters:  01/14/12 197 lb 6.4 oz (89.54 kg)  12/23/11 194 lb 3.2 oz (88.089 kg)  08/07/11  186 lb 4.6 oz (84.5 kg)    Very hoarse amb wm nad Full set of dentures Gen: No acute distress. Well nourished and well groomed.  Neurological: Alert and oriented to person, place, and time. Coordination normal.  Head: Normocephalic and atraumatic.  Eyes: Conjunctivae are normal. Pupils are equal, round, and reactive to light. No scleral icterus.  Neck: Normal range of motion. Neck supple. No tracheal deviation or thyromegaly present. No cervical lymphadenopathy.  Cardiovascular: Normal rate, regular rhythm, normal heart sounds and intact distal pulses. Exam reveals no gallop and no friction rub. No murmur heard.  Respiratory: Effort normal. No respiratory distress. No chest wall tenderness. Breath sounds normal. Very minimal insp crackles  GI: Soft. Bowel sounds are normal. The abdomen is soft and nontender. There is no rebound and no guarding.  Musculoskeletal: Normal range of motion. Extremities are nontender.  Skin: Skin is warm and dry. No rash noted. No diaphoresis. No erythema. No pallor. No clubbing, cyanosis, or edema.  Psychiatric: Normal mood and affect. Behavior is normal. Judgment and thought content normal.      CT chest 08/06/11 No evidence of significant pulmonary embolus. Prominent diffuse  emphysematous changes and fibrosis in the lungs. Nonspecific  appearance of the esophagus suggesting wall thickening versus under  distension. Consider esophagitis.      Assessment & Plan:

## 2012-01-25 ENCOUNTER — Ambulatory Visit: Payer: Medicare Other | Admitting: Internal Medicine

## 2012-02-11 ENCOUNTER — Telehealth: Payer: Self-pay | Admitting: Internal Medicine

## 2012-02-11 ENCOUNTER — Encounter: Payer: Self-pay | Admitting: Internal Medicine

## 2012-02-11 ENCOUNTER — Ambulatory Visit (INDEPENDENT_AMBULATORY_CARE_PROVIDER_SITE_OTHER): Payer: Medicare Other | Admitting: Internal Medicine

## 2012-02-11 VITALS — BP 140/84 | HR 76 | Temp 97.5°F | Ht 71.0 in | Wt 198.0 lb

## 2012-02-11 DIAGNOSIS — J8410 Pulmonary fibrosis, unspecified: Secondary | ICD-10-CM

## 2012-02-11 MED ORDER — METHYLPREDNISOLONE 16 MG PO TABS: ORAL_TABLET | ORAL | Status: DC

## 2012-02-11 NOTE — Telephone Encounter (Signed)
Medrol(methylprednisolone) 16 mg 2 daily until coughing and breathing are better then reduce the dose to one pill daily taken at breakfast ---  I spoke with spouse and advised her have sent in new RX as take 1 daily or as directed since he is only going to be taking 2 daily until his cough/breathing is better. She voiced her understanding and needed nothing further.

## 2012-02-11 NOTE — Patient Instructions (Addendum)
Stay on pantoprazole and pepcid as before plus observe the GERD diet as best you can  Medrol(methylprednisolone)  16 mg 2 daily until coughing and breathing are better then reduce the dose to one pill daily taken at breakfast  See Tammy NP w/in 2 weeks with all your medications, even over the counter meds, separated in two separate bags, the ones you take no matter what vs the ones you stop once you feel better and take only as needed when you feel you need them.   Tammy  will generate for you a new user friendly medication calendar that will put [place] all on the same page re: your medication use.     Without this process, it simply isn't possible to assure that we are providing  your outpatient care  with  the attention to detail we feel you deserve.   If we cannot assure that you're getting that kind of care,  then we cannot manage your problem effectively from this clinic.  Once you have seen Tammy and we are sure that we're all on the same page with your medication use she will arrange follow up with me.

## 2012-02-11 NOTE — Progress Notes (Signed)
Subjective:    Patient ID: James Pope, male    DOB: 1932/11/23   MRN: [id number]  HPI  18 yobm quit smoking 2000 no respiratory sequelae referred 12/23/2011  by Dr James Pope for sob and cough.   12/23/2011 1st pulmonary eval cc indolent onset x one year gradually worse doe 1st with gardening (also knees gave) to point where can no longer do yard work,  Also problem with talking assoc with voice change, cough mostly dry and daytime, not related to meals- tried flovent no better. No h/o RA or unusual exposures. rec Try pantoprazole 40 mg  Take 30-60 min before first meal of the day and Pepcid 20 mg one bedtime until cough   GERD diet  D/c flovent   01/14/2012 f/u ov/James Pope cc no better at all x perhaps a bit less cough. rec Stay on pantoprazole and pepcid as before plus observe the GERD diet as best you can Medrol 16 mg 2 daily until coughing and breathing are better then reduce the dose to one pill daily taken at breakfast   02/11/2012 f/u ov/James Pope did not start any medrol but worse cough and sob x 3-4 days in terms of breathing and generally confused about all his meds, brings in multiple sheets of lists that don't reconcile with one another.   No obvious daytime variabilty or assoc  cp or chest tightness, subjective wheeze overt sinus or hb symptoms. No unusual exp hx   Sleeping ok without nocturnal  or early am exacerbation  of respiratory  c/o's or need for noct saba. Also denies any obvious fluctuation of symptoms with weather or environmental changes or other aggravating or alleviating factors except as outlined above   ROS  The following are not active complaints unless bolded sore throat, dysphagia, dental problems, itching, sneezing,  nasal congestion or excess/ purulent secretions, ear ache,   fever, chills, sweats, unintended wt loss, pleuritic or exertional cp, hemoptysis,  orthopnea pnd or leg swelling, presyncope, palpitations, heartburn, abdominal pain, anorexia,  nausea, vomiting, diarrhea  or change in bowel or urinary habits, change in stools or urine, dysuria,hematuria,  rash, arthralgias mostly knees, visual complaints, headache, numbness weakness or ataxia or problems with walking or coordination,  change in mood/affect or memory.             Objective:   Physical Exam Wt Readings from Last 3 Encounters:  01/14/12 197 lb 6.4 oz (89.54 kg)  12/23/11 194 lb 3.2 oz (88.089 kg)  08/07/11 186 lb 4.6 oz (84.5 kg)    Very hoarse amb wm nad Full set of dentures Gen: No acute distress. Well nourished and well groomed.  Neurological: Alert and oriented to person, place, and time. Coordination normal.  Head: Normocephalic and atraumatic.  Eyes: Conjunctivae are normal. Pupils are equal, round, and reactive to light. No scleral icterus.  Neck: Normal range of motion. Neck supple. No tracheal deviation or thyromegaly present. No cervical lymphadenopathy.  Cardiovascular: Normal rate, regular rhythm, normal heart sounds and intact distal pulses. Exam reveals no gallop and no friction rub. No murmur heard.  Respiratory: Effort normal. No respiratory distress. No chest wall tenderness. Breath sounds normal. Very minimal insp crackles bilaterally GI: Soft. Bowel sounds are normal. The abdomen is soft and nontender. There is no rebound and no guarding.  Musculoskeletal: Normal range of motion. Extremities are nontender.  Skin: Skin is warm and dry. No rash noted. No diaphoresis. No erythema. No pallor. No clubbing, cyanosis, or edema.  Psychiatric: Normal  mood and affect. Behavior is normal. Judgment and thought content normal.      CT chest 08/06/11 No evidence of significant pulmonary embolus. Prominent diffuse  emphysematous changes and fibrosis in the lungs. Nonspecific  appearance of the esophagus suggesting wall thickening versus under  distension. Consider esophagitis.      Assessment & Plan:

## 2012-02-12 NOTE — Assessment & Plan Note (Signed)
-   First suggested on cxr 12/2004 but clinically limiting activity since 2012 - PFT's 11/24/11  VC 2.94 and dllc 29% - 12/23/2011  Walked RA x 2 laps @ 185 ft each stopped due to  desat to 86% -01/14/2012  Walked RA  2 laps @ 185 ft each stopped due to  desat to 87%  - ESR 72 12/23/11  > started steroids 02/11/12  Given the high esr definitely worth challenging him with at least low dose steroids but not able to do so at this point because of confusion with meds.  I had an extended discussion with the patient and wife today lasting 15 to 20 minutes of a 25 minute visit on the following issues:  Unlike when you get a prescription for eyeglasses, it's not possible to always walk out of this or any medical office with a perfect prescription that is immediately effective  based on any test that we offer here.    On the contrary, it may take several weeks for the full impact of changes recommened today - hopefully you will respond well.  If not, then we'll adjust your medication on your next visit accordingly, knowing more then than we can possibly know now.      The most important tool we have here is 20/20 hindsight but can't use it unless he follows instructions between visits consistently.  To keep things simple, I have asked the patient to first separate medicines that are perceived as maintenance, that is to be taken daily "no matter what", from those medicines that are taken on only on an as-needed basis and I have given the patient examples of both, and then return to see our NP to generate a  detailed  medication calendar which should be followed until the next physician sees the patient and updates it.

## 2012-02-22 ENCOUNTER — Other Ambulatory Visit: Payer: Self-pay | Admitting: Internal Medicine

## 2012-02-25 ENCOUNTER — Encounter: Payer: Self-pay | Admitting: Adult Health

## 2012-02-25 ENCOUNTER — Ambulatory Visit (INDEPENDENT_AMBULATORY_CARE_PROVIDER_SITE_OTHER)
Admission: RE | Admit: 2012-02-25 | Discharge: 2012-02-25 | Disposition: A | Discharge status: Home / Self Care | Payer: Medicare Other | Source: Ambulatory Visit | Attending: Adult Health | Admitting: Adult Health

## 2012-02-25 ENCOUNTER — Ambulatory Visit (INDEPENDENT_AMBULATORY_CARE_PROVIDER_SITE_OTHER): Payer: Medicare Other | Admitting: Adult Health

## 2012-02-25 ENCOUNTER — Other Ambulatory Visit (INDEPENDENT_AMBULATORY_CARE_PROVIDER_SITE_OTHER): Payer: Medicare Other

## 2012-02-25 VITALS — BP 128/60 | HR 96 | Temp 97.6°F | Ht 71.0 in | Wt 191.8 lb

## 2012-02-25 DIAGNOSIS — J8410 Pulmonary fibrosis, unspecified: Secondary | ICD-10-CM

## 2012-02-25 NOTE — Patient Instructions (Addendum)
Decrease Medrol 16mg  1 tablet daily .  We are setting you up for overnight oxygen test.  I will call with labs and xray results.  Follow med calendar closely and bring to each visit.  Follow up 3-4 weeks with Dr. Sherene Sires  And As needed

## 2012-02-25 NOTE — Assessment & Plan Note (Signed)
PF -w/ steroid trial. Cough is improved w/ steroid burst and GERD control.  No change in dyspnea level  Repeat CXR , ESR  Check ONO  Patient's medications were reviewed today and patient education was given. Computerized medication calendar was adjusted/completed   Plan  Decrease Medrol 16mg  1 tablet daily .  We are setting you up for overnight oxygen test.  I will call with labs and xray results.  Follow med calendar closely and bring to each visit.  Follow up 3-4 weeks with Dr. Sherene Sires  And As needed

## 2012-02-25 NOTE — Addendum Note (Signed)
Addended by: Boone Master E on: 02/25/2012 02:28 PM   Modules accepted: Orders

## 2012-02-25 NOTE — Progress Notes (Addendum)
  Subjective:    Patient ID: James Pope, male    DOB: [birth year]   MRN: [id number] HPI  18 yobm quit smoking 2000 no respiratory sequelae referred 12/23/2011  by Dr [name] for sob and cough.  12/23/2011 1st pulmonary eval cc indolent onset x one year gradually worse doe 1st with gardening (also knees gave) to point where can no longer do yard work,  Also problem with talking assoc with voice change, cough mostly dry and daytime, not related to meals- tried flovent no better. No h/o RA or unusual exposures. rec Try pantoprazole 40 mg  Take 30-60 min before first meal of the day and Pepcid 20 mg one bedtime until cough   GERD diet  D/c flovent   01/14/2012 f/u ov/Wert cc no better at all x perhaps a bit less cough. rec Stay on pantoprazole and pepcid as before plus observe the GERD diet as best you can Medrol 16 mg 2 daily until coughing and breathing are better then reduce the dose to one pill daily taken at breakfast   02/11/2012 f/u ov/Wert did not start any medrol but worse cough and sob x 3-4 days in terms of breathing and generally confused about all his meds, brings in multiple sheets of lists that don't reconcile with one another.  >>medrol increased 32 mg daily   02/25/2012 Follow up and Med Calendar    Presents for a two-week followup and medication review. Reviewed all his medications and organized them into a medication calendar with patient education. Appears the patient is taking his medications correctly. Last visit. Patient was having increased cough, symptoms. His Medrol was increased to 32 mg daily. This reports a cough is improved, and he is back down to Medrol 16mg  daily   reports SOB is unchanged.    ROS  See HPI      Objective:   Physical Exam   Very hoarse amb wm nad Full set of dentures Gen: No acute distress. Well nourished and well groomed.  Neurological: Alert and oriented to person, place, and time. Coordination normal.  Head: Normocephalic  and atraumatic.  Eyes: Conjunctivae are normal. Pupils are equal, round, and reactive to light. No scleral icterus.  Neck: Normal range of motion. Neck supple. No tracheal deviation or thyromegaly present. No cervical lymphadenopathy.  Cardiovascular: Normal rate, regular rhythm, normal heart sounds and intact distal pulses. Exam reveals no gallop and no friction rub. No murmur heard.  Respiratory: Effort normal. No respiratory distress. No chest wall tenderness. Breath sounds normal. Very minimal insp crackles bilaterally GI: Soft. Bowel sounds are normal. The abdomen is soft and nontender. There is no rebound and no guarding.  Musculoskeletal: Normal range of motion. Extremities are nontender.  Skin: Skin is warm and dry. No rash noted. No diaphoresis. No erythema. No pallor. No clubbing, cyanosis, or edema.  Psychiatric: Normal mood and affect. Behavior is normal. Judgment and thought content normal.      CT chest 08/06/11 No evidence of significant pulmonary embolus. Prominent diffuse  emphysematous changes and fibrosis in the lungs. Nonspecific  appearance of the esophagus suggesting wall thickening versus under  distension. Consider esophagitis.      Assessment & Plan:

## 2012-02-26 ENCOUNTER — Encounter: Payer: Self-pay | Admitting: Adult Health

## 2012-02-26 NOTE — Progress Notes (Signed)
Quick Note:  Called spoke with patient, advised of cxr results / recs as stated by TP. Pt verbalized his understanding and denied any questions. ______ 

## 2012-02-26 NOTE — Progress Notes (Signed)
Quick Note:  Called spoke with patient, advised of lab results / recs as stated by TP. Pt verbalized his understanding and denied any questions. ______ 

## 2012-03-01 ENCOUNTER — Telehealth: Payer: Self-pay | Admitting: Adult Health

## 2012-03-01 ENCOUNTER — Encounter: Payer: Self-pay | Admitting: Adult Health

## 2012-03-01 NOTE — Telephone Encounter (Signed)
2.6.14 ONO Per TP: Minimal desats.  Discuss further at ov.  Pt has upcoming ov w/ MW 2.28.14 Called spoke with patient, advised of ONO results / recs as stated by TP.   Pt okay with these recs and verbalized his understanding. ONO placed up front w/ consult papers for MW. Nothing further needed; will sign off.

## 2012-03-09 ENCOUNTER — Ambulatory Visit (INDEPENDENT_AMBULATORY_CARE_PROVIDER_SITE_OTHER): Payer: Medicare Other | Admitting: Internal Medicine

## 2012-03-09 ENCOUNTER — Other Ambulatory Visit (INDEPENDENT_AMBULATORY_CARE_PROVIDER_SITE_OTHER): Payer: Medicare Other

## 2012-03-09 ENCOUNTER — Telehealth: Payer: Self-pay | Admitting: Internal Medicine

## 2012-03-09 ENCOUNTER — Encounter: Payer: Self-pay | Admitting: Internal Medicine

## 2012-03-09 VITALS — BP 120/72 | HR 89 | Temp 98.5°F | Ht 71.0 in | Wt 192.8 lb

## 2012-03-09 DIAGNOSIS — J8410 Pulmonary fibrosis, unspecified: Secondary | ICD-10-CM

## 2012-03-09 DIAGNOSIS — R252 Cramp and spasm: Secondary | ICD-10-CM | POA: Insufficient documentation

## 2012-03-09 LAB — BASIC METABOLIC PANEL
CO2: 30 mEq/L (ref 19–32)
Calcium: 9.2 mg/dL (ref 8.4–10.5)
Creatinine, Ser: 1.5 mg/dL (ref 0.4–1.5)
GFR: 60.64 mL/min (ref >60.00)
Sodium: 139 mEq/L (ref 135–145)

## 2012-03-09 LAB — PHOSPHORUS: Phosphorus: 3 mg/dL (ref 2.3–4.6)

## 2012-03-09 LAB — MAGNESIUM: Magnesium: 1.8 mg/dL (ref 1.5–2.5)

## 2012-03-09 LAB — CK: Total CK: 76 U/L (ref 7–232)

## 2012-03-09 NOTE — Patient Instructions (Addendum)
Stop atorvastatin  Reduce medrol to 16 mg one half daily  Take quinine for cramps [id number]  Please remember to go to the lab  department downstairs for your tests - we will call you with the results when they are available.     Please schedule a follow up office visit in 2 weeks, sooner if needed

## 2012-03-09 NOTE — Progress Notes (Signed)
Subjective:    Patient ID: James Pope, male    DOB: [birth year]   MRN: [id number]  HPI  60 yobm quit smoking 2000 no respiratory sequelae referred 12/23/2011  by Dr [name] for sob and cough.   12/23/2011 1st pulmonary eval cc indolent onset x one year gradually worse doe 1st with gardening (also knees gave) to point where can no longer do yard work,  Also problem with talking assoc with voice change, cough mostly dry and daytime, not related to meals- tried flovent no better. No h/o RA or unusual exposures. rec Try pantoprazole 40 mg  Take 30-60 min before first meal of the day and Pepcid 20 mg one bedtime until cough   GERD diet  D/c flovent   01/14/2012 f/u ov/Wert cc no better at all x perhaps a bit less cough. rec Stay on pantoprazole and pepcid as before plus observe the GERD diet as best you can Medrol 16 mg 2 daily until coughing and breathing are better then reduce the dose to one pill daily taken at breakfast   02/11/2012 f/u ov/Wert did not start any medrol but worse cough and sob x 3-4 days in terms of breathing and generally confused about all his meds, brings in multiple sheets of lists that don't reconcile with one another.  rec Stay on pantoprazole and pepcid as before plus observe the GERD diet as best you can Medrol(methylprednisolone)  16 mg 2 daily until coughing and breathing are better then reduce the dose to one pill daily taken at breakfast    02/25/12 ov / cough better, breathing not better rec reduce medrol to 16 mg daily Follow med calendar   03/09/2012 f/u ov/Wert no med calendar cc severe muscle cramps, cough resolved, breathing worse on the lower dose of medrol  No obvious daytime variabilty or assoc chronic cough or cp or chest tightness, subjective wheeze overt sinus or hb symptoms. No unusual exp hx or h/o childhood pna/ asthma or premature birth to his knowledge.   Sleeping ok without nocturnal  or early am exacerbation  of respiratory   c/o's or need for noct saba. Also denies any obvious fluctuation of symptoms with weather or environmental changes or other aggravating or alleviating factors except as outlined above   ROS  The following are not active complaints unless bolded sore throat, dysphagia, dental problems, itching, sneezing,  nasal congestion or excess/ purulent secretions, ear ache,   fever, chills, sweats, unintended wt loss, pleuritic or exertional cp, hemoptysis,  orthopnea pnd or leg swelling, presyncope, palpitations, heartburn, abdominal pain, anorexia, nausea, vomiting, diarrhea  or change in bowel or urinary habits, change in stools or urine, dysuria,hematuria,  rash, arthralgias mostly knees, visual complaints, headache, numbness weakness or ataxia or problems with walking or coordination,  change in mood/affect or memory.             Objective:   Physical Exam  Wt 03/09/2012  192 Wt Readings from Last 3 Encounters:  01/14/12 197 lb 6.4 oz (89.54 kg)  12/23/11 194 lb 3.2 oz (88.089 kg)  08/07/11 186 lb 4.6 oz (84.5 kg)    Mod  hoarse amb wm nad Full set of dentures Gen: No acute distress. Well nourished and well groomed.  Neurological: Alert and oriented to person, place, and time. Coordination normal.  Head: Normocephalic and atraumatic.  Eyes: Conjunctivae are normal. Pupils are equal, round, and reactive to light. No scleral icterus.  Neck: Normal range of motion. Neck supple. No tracheal  deviation or thyromegaly present. No cervical lymphadenopathy.  Cardiovascular: Normal rate, regular rhythm, normal heart sounds and intact distal pulses. Exam reveals no gallop and no friction rub. No murmur heard.  Respiratory: Effort normal. No respiratory distress. No chest wall tenderness. Breath sounds normal. Very minimal insp crackles bilaterally GI: Soft. Bowel sounds are normal. The abdomen is soft and nontender. There is no rebound and no guarding.  Musculoskeletal: Normal range of motion. Extremities  are nontender.  Skin: Skin is warm and dry. No rash noted. No diaphoresis. No erythema. No pallor. No clubbing, cyanosis, or edema.  Psychiatric: Normal mood and affect. Behavior is normal. Judgment and thought content normal.      CT chest 08/06/11 No evidence of significant pulmonary embolus. Prominent diffuse  emphysematous changes and fibrosis in the lungs. Nonspecific  appearance of the esophagus suggesting wall thickening versus under  distension. Consider esophagitis.      Assessment & Plan:

## 2012-03-09 NOTE — Telephone Encounter (Signed)
I spoke with spouse. Advised her to keep appt with MW today for acute visit. Nothing further was needed

## 2012-03-10 NOTE — Progress Notes (Signed)
Quick Note:  Spoke with patient, made him aware of results as listed below per Dr. Sherene Sires. Pt verbalized understanding and nothing further needed at this time. ______

## 2012-03-13 NOTE — Assessment & Plan Note (Addendum)
-   First suggested on cxr 12/2004 but clinically limiting activity since 2012 - PFT's 11/24/11  VC 2.94 and dllc 29% - 12/23/2011  Walked RA x 2 laps @ 185 ft each stopped due to  desat to 86% -01/14/2012  Walked RA  2 laps @ 185 ft each stopped due to  desat to 87%  - ESR 72 12/23/11  > started steroids 02/11/12 -ESR 02/25/2012 >15  -ONO 02/25/2012 >min desats -~2% of time <88% -Med calendar >02/25/2012  > esr nl so rec  reduce medrol  to 16 mg daily - 03/09/2012  Walked RA x 3 laps @ 185 ft each stopped due to  desat p 3 which is improved -Reduce medrol to 8 mg daily effective 03/10/12  I had an extended discussion with the patient today lasting 15 to 20 minutes of a 25 minute visit on the following issues:   The goal with a chronic steroid dependent illness is always arriving at the lowest effective dose that controls the disease/symptoms and not accepting a set "formula" which is based on statistics or guidelines that don't always take into account patient  variability or the natural hx of the dz in every individual patient, which may well vary over time.  For now therefore I recommend the patient maintain  A floor of 8 mg daily

## 2012-03-13 NOTE — Assessment & Plan Note (Signed)
Likely due to steroid/ statin combo  rec min steroids and off statins for at least 2 weeks before consider rechallenge.

## 2012-03-18 ENCOUNTER — Ambulatory Visit: Payer: Medicare Other | Admitting: Internal Medicine

## 2012-03-21 ENCOUNTER — Encounter: Payer: Self-pay | Admitting: Adult Health

## 2012-03-25 ENCOUNTER — Ambulatory Visit: Payer: Medicare Other | Admitting: Internal Medicine

## 2012-03-28 ENCOUNTER — Other Ambulatory Visit (INDEPENDENT_AMBULATORY_CARE_PROVIDER_SITE_OTHER): Payer: Medicare Other

## 2012-03-28 ENCOUNTER — Ambulatory Visit (INDEPENDENT_AMBULATORY_CARE_PROVIDER_SITE_OTHER): Payer: Medicare Other | Admitting: Internal Medicine

## 2012-03-28 ENCOUNTER — Encounter: Payer: Self-pay | Admitting: Internal Medicine

## 2012-03-28 VITALS — BP 124/78 | HR 62 | Temp 98.1°F | Ht 71.0 in | Wt 195.0 lb

## 2012-03-28 DIAGNOSIS — J8410 Pulmonary fibrosis, unspecified: Secondary | ICD-10-CM

## 2012-03-28 DIAGNOSIS — I10 Essential (primary) hypertension: Secondary | ICD-10-CM

## 2012-03-28 MED ORDER — METHYLPREDNISOLONE 16 MG PO TABS: ORAL_TABLET | ORAL | Status: DC

## 2012-03-28 MED ORDER — ATENOLOL 25 MG PO TABS: ORAL_TABLET | ORAL | Status: DC

## 2012-03-28 NOTE — Progress Notes (Signed)
Subjective:    Patient ID: James Pope, male    DOB: 1932-11-30   MRN: [id number]  HPI  85 yobm quit smoking 2000 no respiratory sequelae referred 12/23/2011  by Dr [name] for sob and cough.   12/23/2011 1st pulmonary eval cc indolent onset x one year gradually worse doe 1st with gardening (also knees gave) to point where can no longer do yard work,  Also problem with talking assoc with voice change, cough mostly dry and daytime, not related to meals- tried flovent no better. No h/o RA or unusual exposures. rec Try pantoprazole 40 mg  Take 30-60 min before first meal of the day and Pepcid 20 mg one bedtime until cough   GERD diet  D/c flovent   01/14/2012 f/u ov/Wert cc no better at all x perhaps a bit less cough. rec Stay on pantoprazole and pepcid as before plus observe the GERD diet as best you can Medrol 16 mg 2 daily until coughing and breathing are better then reduce the dose to one pill daily taken at breakfast   02/11/2012 f/u ov/Wert did not start any medrol but worse cough and sob x 3-4 days in terms of breathing and generally confused about all his meds, brings in multiple sheets of lists that don't reconcile with one another.  rec Stay on pantoprazole and pepcid as before plus observe the GERD diet as best you can Medrol(methylprednisolone)  16 mg 2 daily until coughing and breathing are better then reduce the dose to one pill daily taken at breakfast    02/25/12 ov / cough better, breathing not better rec reduce medrol to 16 mg daily Follow med calendar   03/09/2012 f/u ov/Wert no med calendar cc severe muscle cramps, cough resolved, breathing worse on the lower dose of medrol Stop atorvastatin Reduce medrol to 16 mg one half daily Take quinine for cramps   03/28/2012 f/u ov/Wert  Cramps gone never took quinine but cc doe worse on lower dose eg walking around the outside house and down to the street and back to house s stopping, cough a little more ,  dry.  No obvious daytime variabilty or assoc  cp or chest tightness, subjective wheeze overt sinus or hb symptoms. No unusual exp hx or h/o childhood pna/ asthma or premature birth to his knowledge.   Sleeping ok without nocturnal  or early am exacerbation  of respiratory  c/o's or need for noct saba. Also denies any obvious fluctuation of symptoms with weather or environmental changes or other aggravating or alleviating factors except as outlined above   ROS  The following are not active complaints unless bolded sore throat, dysphagia, dental problems, itching, sneezing,  nasal congestion or excess/ purulent secretions, ear ache,   fever, chills, sweats, unintended wt loss, pleuritic or exertional cp, hemoptysis,  orthopnea pnd or leg swelling, presyncope, palpitations, heartburn, abdominal pain, anorexia, nausea, vomiting, diarrhea  or change in bowel or urinary habits, change in stools or urine, dysuria,hematuria,  rash, arthralgias mostly knees better, visual complaints, headache, numbness weakness or ataxia or problems with walking or coordination,  change in mood/affect or memory.             Objective:   Physical Exam  Wt 03/09/2012  192 >  195 03/28/2012  Wt Readings from Last 3 Encounters:  01/14/12 197 lb 6.4 oz (89.54 kg)  12/23/11 194 lb 3.2 oz (88.089 kg)  08/07/11 186 lb 4.6 oz (84.5 kg)    Mod  hoarse  amb wm nad Full set of dentures Gen: No acute distress. Well nourished and well groomed.  Neurological: Alert and oriented to person, place, and time. Coordination normal.  Head: Normocephalic and atraumatic.  Eyes: Conjunctivae are normal. Pupils are equal, round, and reactive to light. No scleral icterus.  Neck: Normal range of motion. Neck supple. No tracheal deviation or thyromegaly present. No cervical lymphadenopathy.  Cardiovascular: Normal rate, regular rhythm, normal heart sounds and intact distal pulses. Exam reveals no gallop and no friction rub. No murmur heard.   Respiratory: Effort normal. No respiratory distress. No chest wall tenderness. Breath sounds normal. Very minimal insp crackles bilaterally s cough GI: Soft. Bowel sounds are normal. The abdomen is soft and nontender. There is no rebound and no guarding.  Musculoskeletal: Normal range of motion. Extremities are nontender.  Skin: Skin is warm and dry. No rash noted. No diaphoresis. No erythema. No pallor. No clubbing, cyanosis, or edema.  Psychiatric: Normal mood and affect. Behavior is normal. Judgment and thought content normal.      CXR 02/25/12   Chronic interstitial obstructive lung disease. No acute  Abnormalities.   03/28/2012 ESR on 8 mg per day = 49       Assessment & Plan:

## 2012-03-28 NOTE — Assessment & Plan Note (Signed)
-   First suggested on cxr 12/2004 but clinically limiting activity since 2012 - PFT's 11/24/11  VC 2.94 and dllc 29%, FEV1 80%, ratio 74, no change w/ BD  - 12/23/2011  Walked RA x 2 laps @ 185 ft each stopped due to  desat to 86% -01/14/2012  Walked RA  2 laps @ 185 ft each stopped due to  desat to 87%  - ESR 72 12/23/11  > started steroids 02/11/12 -ESR 02/25/2012 >15  -ONO 02/25/2012 >min desats -~2% of time <88% -Med calendar >02/25/2012  > esr 15 so rec  reduce medrol  to 16 mg daily - 03/09/2012  Walked RA x 3 laps @ 185 ft each stopped due to  desat p 3    - 03/28/2012  Walked RA x 3 laps @ 185 ft each stopped due to  desat p 3 -Reduce medrol to 8 mg daily effective 03/10/12> worse 03/28/2012 so changed to 32 mg daily with esr = 49  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  The goal with a chronic steroid dependent illness is always arriving at the lowest effective dose that controls the disease/symptoms and not accepting a set "formula" which is based on statistics or guidelines that don't always take into account patient  variability or the natural hx of the dz in every individual patient, which may well vary over time.  For now therefore I recommend the patient maintain  32 mg as a ceiling and 16 mg per day as floor and monitor cough, sob and esr.

## 2012-03-28 NOTE — Patient Instructions (Addendum)
Increase Medrol to 16 mg 2 each am until better then 1 daily  Decrease atenolol to 25 mg one half daily   See calendar for specific medication instructions and bring it back for each and every office visit for every healthcare James Pope you see.  Without it,  you may not receive the best quality medical care that we feel you deserve.  You will note that the calendar groups together  your maintenance  medications that are timed at particular times of the day.  Think of this as your checklist for what your doctor has instructed you to do until your next evaluation to see what benefit  there is  to staying on a consistent group of medications intended to keep you well.  The other group at the bottom is entirely up to you to use as you see fit  for specific symptoms that may arise between visits that require you to treat them on an as needed basis.  Think of this as your action plan or "what if" list.   Separating the top medications from the bottom group is fundamental to providing you adequate care going forward.    Return in one month with all meds and med calendar.

## 2012-03-28 NOTE — Assessment & Plan Note (Signed)
Over treated at present > reduce atenolol  to 12.5 mg daily

## 2012-03-29 ENCOUNTER — Telehealth: Payer: Self-pay | Admitting: Internal Medicine

## 2012-03-29 NOTE — Progress Notes (Signed)
Quick Note:  Spoke with James Pope, informed her results and recs as listed below. James Pope verbalized understanding and nothing further needed at this time. ______

## 2012-03-29 NOTE — Telephone Encounter (Signed)
Spoke with James Pope, informed her results and recs as listed below. James Pope verbalized understanding and nothing further needed at this time.  Result Note    Call patient : Study is c/w too low a dose of medrol so follow the recs as per ov    Last OV 03/28/12 Patient Instructions    Increase Medrol to 16 mg 2 each am until better then 1 daily  Decrease atenolol to 25 mg one half daily  See calendar for specific medication instructions and bring it back for each and every office visit for every healthcare provider you see. Without it, you may not receive the best quality medical care that we feel you deserve.  You will note that the calendar groups together your maintenance medications that are timed at particular times of the day. Think of this as your checklist for what your doctor has instructed you to do until your next evaluation to see what benefit there is to staying on a consistent group of medications intended to keep you well. The other group at the bottom is entirely up to you to use as you see fit for specific symptoms that may arise between visits that require you to treat them on an as needed basis. Think of this as your action plan or "what if" list.  Separating the top medications from the bottom group is fundamental to providing you adequate care going forward.  Return in one month with all meds and med calendar.

## 2012-04-07 ENCOUNTER — Other Ambulatory Visit (HOSPITAL_COMMUNITY): Payer: Self-pay | Admitting: Cardiovascular Disease

## 2012-04-07 DIAGNOSIS — I25810 Atherosclerosis of coronary artery bypass graft(s) without angina pectoris: Secondary | ICD-10-CM

## 2012-04-12 ENCOUNTER — Encounter (HOSPITAL_COMMUNITY): Payer: Self-pay | Admitting: *Deleted

## 2012-04-12 ENCOUNTER — Emergency Department (HOSPITAL_COMMUNITY): Payer: Medicare Other

## 2012-04-12 ENCOUNTER — Inpatient Hospital Stay (HOSPITAL_COMMUNITY)
Admission: EM | Admit: 2012-04-12 | Discharge: 2012-04-14 | DRG: 193 | Disposition: A | Discharge status: Home Health | Payer: Medicare Other | Attending: Internal Medicine | Admitting: Internal Medicine

## 2012-04-12 DIAGNOSIS — J9600 Acute respiratory failure, unspecified whether with hypoxia or hypercapnia: Secondary | ICD-10-CM | POA: Diagnosis present

## 2012-04-12 DIAGNOSIS — J8410 Pulmonary fibrosis, unspecified: Secondary | ICD-10-CM | POA: Diagnosis present

## 2012-04-12 DIAGNOSIS — M109 Gout, unspecified: Secondary | ICD-10-CM | POA: Diagnosis present

## 2012-04-12 DIAGNOSIS — A419 Sepsis, unspecified organism: Secondary | ICD-10-CM | POA: Diagnosis present

## 2012-04-12 DIAGNOSIS — I129 Hypertensive chronic kidney disease with stage 1 through stage 4 chronic kidney disease, or unspecified chronic kidney disease: Secondary | ICD-10-CM | POA: Diagnosis present

## 2012-04-12 DIAGNOSIS — I10 Essential (primary) hypertension: Secondary | ICD-10-CM | POA: Diagnosis present

## 2012-04-12 DIAGNOSIS — Z87891 Personal history of nicotine dependence: Secondary | ICD-10-CM

## 2012-04-12 DIAGNOSIS — D72829 Elevated white blood cell count, unspecified: Secondary | ICD-10-CM | POA: Diagnosis present

## 2012-04-12 DIAGNOSIS — Z79899 Other long term (current) drug therapy: Secondary | ICD-10-CM

## 2012-04-12 DIAGNOSIS — E785 Hyperlipidemia, unspecified: Secondary | ICD-10-CM | POA: Diagnosis present

## 2012-04-12 DIAGNOSIS — N189 Chronic kidney disease, unspecified: Secondary | ICD-10-CM | POA: Diagnosis present

## 2012-04-12 DIAGNOSIS — J9819 Other pulmonary collapse: Secondary | ICD-10-CM | POA: Diagnosis present

## 2012-04-12 DIAGNOSIS — J9601 Acute respiratory failure with hypoxia: Secondary | ICD-10-CM | POA: Diagnosis present

## 2012-04-12 DIAGNOSIS — T380X5A Adverse effect of glucocorticoids and synthetic analogues, initial encounter: Secondary | ICD-10-CM | POA: Diagnosis present

## 2012-04-12 DIAGNOSIS — J189 Pneumonia, unspecified organism: Principal | ICD-10-CM | POA: Diagnosis present

## 2012-04-12 LAB — COMPREHENSIVE METABOLIC PANEL
ALT: 24 U/L (ref 0–53)
Calcium: 8.4 mg/dL (ref 8.4–10.5)
GFR calc Af Amer: 47 mL/min — ABNORMAL LOW (ref >90)
Glucose, Bld: 134 mg/dL — ABNORMAL HIGH (ref 70–99)
Sodium: 139 mEq/L (ref 135–145)
Total Protein: 6 g/dL (ref 6.0–8.3)

## 2012-04-12 LAB — CBC
HCT: 54.3 % — ABNORMAL HIGH (ref 39.0–52.0)
Hemoglobin: 14.6 g/dL (ref 13.0–17.0)
MCH: 31.9 pg (ref 26.0–34.0)
MCHC: 26.9 g/dL — ABNORMAL LOW (ref 30.0–36.0)
MCV: 118.6 fL — ABNORMAL HIGH (ref 78.0–100.0)
Platelets: 140 10*3/uL — ABNORMAL LOW (ref 150–400)
RBC: 4.58 MIL/uL (ref 4.22–5.81)
RDW: 16.3 % — ABNORMAL HIGH (ref 11.5–15.5)
WBC: 13.3 10*3/uL — ABNORMAL HIGH (ref 4.0–10.5)

## 2012-04-12 LAB — CBC WITH DIFFERENTIAL/PLATELET
Basophils Absolute: 0 10*3/uL (ref 0.0–0.1)
Basophils Relative: 0 % (ref 0–1)
HCT: 38.5 % — ABNORMAL LOW (ref 39.0–52.0)
Hemoglobin: 13.1 g/dL (ref 13.0–17.0)
Lymphocytes Relative: 7 % — ABNORMAL LOW (ref 12–46)
Monocytes Relative: 8 % (ref 3–12)
Neutro Abs: 8.4 10*3/uL — ABNORMAL HIGH (ref 1.7–7.7)
Neutrophils Relative %: 85 % — ABNORMAL HIGH (ref 43–77)
WBC: 9.9 10*3/uL (ref 4.0–10.5)

## 2012-04-12 LAB — URINALYSIS, ROUTINE W REFLEX MICROSCOPIC
Glucose, UA: NEGATIVE mg/dL
Hgb urine dipstick: NEGATIVE
Ketones, ur: NEGATIVE mg/dL
Leukocytes, UA: NEGATIVE
Nitrite: NEGATIVE
Protein, ur: NEGATIVE mg/dL
Specific Gravity, Urine: 1.034 — ABNORMAL HIGH (ref 1.005–1.030)
Urobilinogen, UA: 0.2 mg/dL (ref 0.0–1.0)
pH: 5.5 (ref 5.0–8.0)

## 2012-04-12 LAB — BASIC METABOLIC PANEL
BUN: 30 mg/dL — ABNORMAL HIGH (ref 6–23)
CO2: 25 mEq/L (ref 19–32)
Calcium: 9.9 mg/dL (ref 8.4–10.5)
Chloride: 98 mEq/L (ref 96–112)
Creatinine, Ser: 1.61 mg/dL — ABNORMAL HIGH (ref 0.50–1.35)
GFR calc Af Amer: 46 mL/min — ABNORMAL LOW (ref >90)
GFR calc non Af Amer: 40 mL/min — ABNORMAL LOW (ref >90)
Glucose, Bld: 140 mg/dL — ABNORMAL HIGH (ref 70–99)
Potassium: 3.6 mEq/L (ref 3.5–5.1)
Sodium: 139 mEq/L (ref 135–145)

## 2012-04-12 LAB — MRSA PCR SCREENING: MRSA by PCR: NEGATIVE

## 2012-04-12 LAB — INFLUENZA PANEL BY PCR (TYPE A & B)
H1N1 flu by pcr: NOT DETECTED
Influenza A By PCR: NEGATIVE

## 2012-04-12 LAB — PHOSPHORUS: Phosphorus: 4.4 mg/dL (ref 2.3–4.6)

## 2012-04-12 LAB — STREP PNEUMONIAE URINARY ANTIGEN: Strep Pneumo Urinary Antigen: NEGATIVE

## 2012-04-12 LAB — CG4 I-STAT (LACTIC ACID): Lactic Acid, Venous: 7.12 mmol/L — ABNORMAL HIGH (ref 0.5–2.2)

## 2012-04-12 MED ORDER — PIPERACILLIN-TAZOBACTAM 3.375 G IVPB 30 MIN
3.3750 g | Freq: Once | INTRAVENOUS | Status: AC
  Administered 2012-04-12: 3.375 g via INTRAVENOUS
  Filled 2012-04-12: qty 50

## 2012-04-12 MED ORDER — PANTOPRAZOLE SODIUM 40 MG PO TBEC
40.0000 mg | DELAYED_RELEASE_TABLET | Freq: Every day | ORAL | Status: DC
  Administered 2012-04-12 – 2012-04-14 (×3): 40 mg via ORAL
  Filled 2012-04-12 (×3): qty 1

## 2012-04-12 MED ORDER — METHYLPREDNISOLONE 16 MG PO TABS
16.0000 mg | ORAL_TABLET | Freq: Every morning | ORAL | Status: DC
  Administered 2012-04-12 – 2012-04-14 (×3): 16 mg via ORAL
  Filled 2012-04-12 (×3): qty 1

## 2012-04-12 MED ORDER — MORPHINE SULFATE 2 MG/ML IJ SOLN
1.0000 mg | INTRAMUSCULAR | Status: DC | PRN
Start: 2012-04-12 — End: 2012-04-14

## 2012-04-12 MED ORDER — DEXTROSE 5 % IV SOLN
1.0000 g | INTRAVENOUS | Status: DC
  Administered 2012-04-12: 1 g via INTRAVENOUS
  Filled 2012-04-12 (×2): qty 10

## 2012-04-12 MED ORDER — ACETAMINOPHEN 325 MG PO TABS
650.0000 mg | ORAL_TABLET | Freq: Once | ORAL | Status: AC
  Administered 2012-04-12: 650 mg via ORAL
  Filled 2012-04-12: qty 2

## 2012-04-12 MED ORDER — LEVOFLOXACIN IN D5W 750 MG/150ML IV SOLN: 750.0000 mg | INTRAVENOUS | Status: DC

## 2012-04-12 MED ORDER — VANCOMYCIN HCL 1000 MG IV SOLR
750.0000 mg | Freq: Two times a day (BID) | INTRAVENOUS | Status: DC
  Administered 2012-04-13 – 2012-04-14 (×3): 750 mg via INTRAVENOUS
  Filled 2012-04-12 (×4): qty 750

## 2012-04-12 MED ORDER — ATENOLOL 12.5 MG HALF TABLET
12.5000 mg | ORAL_TABLET | Freq: Every morning | ORAL | Status: DC
  Administered 2012-04-12 – 2012-04-14 (×3): 12.5 mg via ORAL
  Filled 2012-04-12 (×3): qty 1

## 2012-04-12 MED ORDER — ALBUTEROL SULFATE (5 MG/ML) 0.5% IN NEBU: 2.5000 mg | INHALATION_SOLUTION | RESPIRATORY_TRACT | Status: DC | PRN

## 2012-04-12 MED ORDER — SIMETHICONE 80 MG PO CHEW
80.0000 mg | CHEWABLE_TABLET | Freq: Four times a day (QID) | ORAL | Status: DC | PRN
  Filled 2012-04-12: qty 1

## 2012-04-12 MED ORDER — ACETAMINOPHEN 650 MG RE SUPP: 650.0000 mg | Freq: Four times a day (QID) | RECTAL | Status: DC | PRN

## 2012-04-12 MED ORDER — SODIUM CHLORIDE 0.9 % IV SOLN
INTRAVENOUS | Status: DC
  Administered 2012-04-12: 17:00:00 via INTRAVENOUS

## 2012-04-12 MED ORDER — ACETAMINOPHEN 325 MG PO TABS: 650.0000 mg | ORAL_TABLET | Freq: Four times a day (QID) | ORAL | Status: DC | PRN

## 2012-04-12 MED ORDER — COLCHICINE 0.6 MG PO TABS
0.6000 mg | ORAL_TABLET | Freq: Every day | ORAL | Status: DC
  Administered 2012-04-12 – 2012-04-13 (×2): 0.6 mg via ORAL
  Filled 2012-04-12 (×3): qty 1

## 2012-04-12 MED ORDER — ASPIRIN EC 81 MG PO TBEC
81.0000 mg | DELAYED_RELEASE_TABLET | Freq: Every morning | ORAL | Status: DC
  Administered 2012-04-12 – 2012-04-14 (×3): 81 mg via ORAL
  Filled 2012-04-12 (×3): qty 1

## 2012-04-12 MED ORDER — SODIUM CHLORIDE 0.9 % IV BOLUS (SEPSIS)
1000.0000 mL | Freq: Once | INTRAVENOUS | Status: AC
  Administered 2012-04-12: 1000 mL via INTRAVENOUS

## 2012-04-12 MED ORDER — IPRATROPIUM BROMIDE 0.02 % IN SOLN: 0.5000 mg | RESPIRATORY_TRACT | Status: DC | PRN

## 2012-04-12 MED ORDER — ADULT MULTIVITAMIN W/MINERALS CH
1.0000 | ORAL_TABLET | Freq: Every morning | ORAL | Status: DC
  Administered 2012-04-12 – 2012-04-14 (×3): 1 via ORAL
  Filled 2012-04-12 (×3): qty 1

## 2012-04-12 MED ORDER — TAMSULOSIN HCL 0.4 MG PO CAPS
0.4000 mg | ORAL_CAPSULE | Freq: Every day | ORAL | Status: DC
  Administered 2012-04-12 – 2012-04-13 (×2): 0.4 mg via ORAL
  Filled 2012-04-12 (×3): qty 1

## 2012-04-12 MED ORDER — VANCOMYCIN HCL IN DEXTROSE 1-5 GM/200ML-% IV SOLN
1000.0000 mg | Freq: Once | INTRAVENOUS | Status: AC
  Administered 2012-04-12: 1000 mg via INTRAVENOUS
  Filled 2012-04-12: qty 200

## 2012-04-12 MED ORDER — ONDANSETRON HCL 4 MG/2ML IJ SOLN: 4.0000 mg | Freq: Four times a day (QID) | INTRAMUSCULAR | Status: DC | PRN

## 2012-04-12 MED ORDER — FAMOTIDINE 20 MG PO TABS
20.0000 mg | ORAL_TABLET | Freq: Every day | ORAL | Status: DC
  Administered 2012-04-12 – 2012-04-13 (×2): 20 mg via ORAL
  Filled 2012-04-12 (×3): qty 1

## 2012-04-12 MED ORDER — ONDANSETRON HCL 4 MG PO TABS: 4.0000 mg | ORAL_TABLET | Freq: Four times a day (QID) | ORAL | Status: DC | PRN

## 2012-04-12 MED ORDER — HYDROCODONE-ACETAMINOPHEN 5-325 MG PO TABS: 1.0000 | ORAL_TABLET | ORAL | Status: DC | PRN

## 2012-04-12 MED ORDER — AZITHROMYCIN 500 MG IV SOLR
500.0000 mg | Freq: Once | INTRAVENOUS | Status: AC
  Administered 2012-04-12: 500 mg via INTRAVENOUS
  Filled 2012-04-12: qty 500

## 2012-04-12 MED ORDER — SODIUM CHLORIDE 0.9 % IV SOLN
INTRAVENOUS | Status: DC
  Administered 2012-04-12: 18:00:00 via INTRAVENOUS
  Administered 2012-04-13: 1000 mL via INTRAVENOUS

## 2012-04-12 MED ORDER — QUININE SULFATE 324 MG PO CAPS: 648.0000 mg | ORAL_CAPSULE | Freq: Three times a day (TID) | ORAL | Status: DC

## 2012-04-12 NOTE — ED Notes (Signed)
Pt provided with urinal. Unable to void at this time.

## 2012-04-12 NOTE — ED Notes (Signed)
Pt alert and oriented x4. Respirations even and unlabored, bilateral symmetrical rise and fall of chest. Skin warm and dry. In no acute distress. Denies needs.   

## 2012-04-12 NOTE — ED Notes (Signed)
Sent from PCP, pt has increased SOB and weakness. Denies pain

## 2012-04-12 NOTE — ED Notes (Signed)
Report given to juilette, rn

## 2012-04-12 NOTE — H&P (Signed)
[facility] History and Physical  JAMERE STIDHAM WUJ:[id number] DOB: Apr 09, 1932 DOA: 04/12/2012  Referring physician: ER physician PCP: Laurena Slimmer, MD   Chief Complaint: fever and shortness of breath  HPI:  79 year old male with past medical history of pulmonary fibrosis, Hypertension, gout, dyslipidemia who presented from primary care office to [facility] ED 04/12/12 for evaluation of fever and cough. Patient has reported progressively worsening shortness of breath associated with non productive cough and generalized weaknessz. In addition he reported fevers and chills at home. Patient also reported abdominal discomfort associated with nausea and vomiting but this has now resolved. No complaints of chest pain, no palpitations. No reports of bloody stool or urine. No lightheadedness or loss of consciousness. In ED, evaluation included CXR which showed bibasilar atelectasis, scarring and early infiltrates. Patient was febrile with Tmax = 102.7 F. His BP was 105/66 with HR of 124. His O2 saturation was 93% on 4 L nasal canula. His CBC revealed leukocytosis of 13.3. His creatinine was 1.61 on admission.  Assessment and Plan:  Principal Problem:   *Acute respiratory failure with hypoxia  Secondary to community acquired pneumonia.  Respiratory status is now stable Continue albuterol and atrovent Q 4 hours PRN shortness of breath or wheezing.  Patient started on empiric antibiotics in ED.   Pneumonia order set in place. Per order set we ordered vanco, Levaquin and rocephin (per ICU pneumonia protocol). Follow up blood culture results, resp culture result, flu test, legionella and strep pneumoniae.  Order procalcitonin level  Continue IV fluids. Active Problems:   Sepsis  Secondary to pneumonia  Follow up procalcitonin level. Lactic acid is 7.1 on admission.  Continue vancomycin, Levaquin and rocephin per pneumonia icu protocol   CAP (community acquired pneumonia)  Management as  above   HTN (hypertension)  Continue home medications   CKD (chronic kidney disease)  Creatinine  in past (07/2011) 1.43 and on this admission 1.61  Continue IV fluids  Follow up renal function in am   Leukocytosis  Due to steroid patient is taking at home for history of pulmonary fibrosis and due to pneumonia   Hyperlipidemia  Continue home medications   Gout  Continue colchicine  Code Status: Full Family Communication: Pt at bedside Disposition Plan: Admit for further evaluation  Manson Passey, MD  [facility] Pager [phone]  If 7PM-7AM, please contact night-coverage [email or web address] Password [facility] 04/12/2012, 3:56 PM  Review of Systems:  Constitutional: positive for fever, chills and malaise/fatigue. positive for diaphoresis.  HENT: Negative for hearing loss, ear pain, nosebleeds, congestion, sore throat, neck pain, tinnitus and ear discharge.  Eyes: Negative for blurred vision, double vision, photophobia, pain, discharge and redness.  Respiratory: positive for shortness of breath and sweating, cough; no hemoptysis.   Cardiovascular: Negative for chest pain, palpitations, orthopnea, claudication and leg swelling.  Gastrointestinal: Negative for nausea, vomiting and abdominal pain. Negative for heartburn, constipation, blood in stool and melena.  Genitourinary: Negative for dysuria, urgency, frequency, hematuria and flank pain.  Musculoskeletal: Negative for myalgias, back pain, joint pain and falls.  Skin: Negative for itching and rash.  Neurological: Negative for dizziness and weakness. Negative for tingling, tremors, sensory change, speech change, focal weakness, loss of consciousness and headaches.  Endo/Heme/Allergies: Negative for environmental allergies and polydipsia. Does not bruise/bleed easily.  Psychiatric/Behavioral: Negative for suicidal ideas. The patient is not nervous/anxious.      Past Medical History  Diagnosis Date  . Hypertension   . Gout   .  Hyperlipidemia  Past Surgical History  Procedure Laterality Date  . Cardiac catheterization    . Skin graft      rt arm  . Colonoscopy      x 2   Social History:  reports that he quit smoking about 14 years ago. His smoking use included Cigarettes. He has a 40 pack-year smoking history. He has never used smokeless tobacco. He reports that he does not drink alcohol or use illicit drugs.  Allergies  Allergen Reactions  . Other Other (See Comments)    Medication-NapraPAC (prevacid and naprosyn) Reaction-Hiccups  . Prednisone Other (See Comments)    Hiccups    Family History:  Family History  Problem Relation Age of Onset  . Diabetes type II Father   . Prostate cancer Father   . Prostate cancer Brother   . Throat cancer Brother     smoked     Prior to Admission medications   Medication Sig Start Date End Date Taking? Authorizing Provider  aspirin EC 81 MG tablet Take 81 mg by mouth every morning.    Yes Historical Provider, MD  atenolol (TENORMIN) 25 MG tablet Take 12.5 mg by mouth every morning. 03/28/12  Yes Nyoka Cowden, MD  calcium carbonate (TUMS - DOSED IN MG ELEMENTAL CALCIUM) 500 MG chewable tablet Per bottle as needed for heartburn   Yes Historical Provider, MD  Calcium Carbonate-Vitamin D (CALCIUM 600 + D PO) Take 1 tablet by mouth at bedtime.   Yes Historical Provider, MD  colchicine 0.6 MG tablet Take 0.6 mg by mouth at bedtime.    Yes Historical Provider, MD  famotidine (PEPCID) 20 MG tablet Take 20 mg by mouth at bedtime. One at bedtime 12/23/11  Yes Nyoka Cowden, MD  Homeopathic Products ([employer] LEG CRAMPS PAIN RELIEF PO) Take 1 tablet by mouth daily as needed.   Yes Historical Provider, MD  methylPREDNISolone (MEDROL) 16 MG tablet Take 16 mg by mouth every morning. Take 2 tablets daily until better then one daily 03/28/12  Yes Nyoka Cowden, MD  Multiple Vitamins-Minerals (MULTIVITAMINS THER. W/MINERALS) TABS Take 1 tablet by mouth every morning.    Yes  Historical Provider, MD  pantoprazole (PROTONIX) 40 MG tablet TAKE ONE TABLET BY MOUTH ONE TIME DAILY 30 TO 60 MINUTES BEFORE FIRST MEAL OF THE DAY 02/22/12  Yes Nyoka Cowden, MD  quiNINE (QUALAQUIN) 324 MG capsule Take 648 mg by mouth every 8 (eight) hours.   Yes Historical Provider, MD  simethicone (MYLICON) 125 MG chewable tablet Chew by mouth. Per bottle as needed for gas/bloating   Yes Historical Provider, MD  Tamsulosin HCl (FLOMAX) 0.4 MG CAPS Take 0.4 mg by mouth at bedtime.    Yes Historical Provider, MD   Physical Exam: Filed Vitals:   04/12/12 1315 04/12/12 1400 04/12/12 1414 04/12/12 1451  BP: 105/66     Pulse: 116 122 118 101  Temp:      TempSrc:      Resp: 22 24 25 23   SpO2: 94% 93% 93% 97%    Physical Exam  Constitutional: Appears ill, warm. No distress.  HENT: Normocephalic. No tonsillar erythema or exudates.  Eyes: Conjunctivae and EOM are normal. PERRLA, no scleral icterus.  Neck: Normal ROM. Neck supple. No JVD. No tracheal deviation. No thyromegaly.  CVS: RRR, S1/S2  Appreciated, no JVD  Pulmonary: diminished breath sounds, mild rhonchi in upper lobes, basilar crackles..  Abdominal: Soft. BS +,  no distension, tenderness, rebound or guarding.  Musculoskeletal: Normal range  of motion. No edema and no tenderness.  Lymphadenopathy: No lymphadenopathy noted, cervical, inguinal. Neuro: Alert. Normal reflexes, muscle tone coordination. No cranial nerve deficit. Skin: Skin is warm and dry. No rash noted. Not diaphoretic. No erythema. No pallor.  Psychiatric: Normal mood and affect. Behavior, judgment, thought content normal.   Labs on Admission:  Basic Metabolic Panel:  Recent Labs Lab 04/12/12 1245  NA 139  K 3.6  CL 98  CO2 25  GLUCOSE 140*  BUN 30*  CREATININE 1.61*  CALCIUM 9.9   Liver Function Tests: No results found for this basename: AST, ALT, ALKPHOS, BILITOT, PROT, ALBUMIN,  in the last 168 hours No results found for this basename: LIPASE,  AMYLASE,  in the last 168 hours No results found for this basename: AMMONIA,  in the last 168 hours CBC:  Recent Labs Lab 04/12/12 1245  WBC 13.3*  HGB 14.6  HCT 54.3*  MCV 118.6*  PLT 140*   Cardiac Enzymes: No results found for this basename: CKTOTAL, CKMB, CKMBINDEX, TROPONINI,  in the last 168 hours BNP: No components found with this basename: POCBNP,  CBG: No results found for this basename: GLUCAP,  in the last 168 hours  Radiological Exams on Admission: Dg Chest 2 View 04/12/2012   IMPRESSION: No pulmonary edema.  Small bilateral basilar atelectasis, scarring or early infiltrate.   Original Report Authenticated By: Natasha Mead, M.D.     EKG: Normal sinus rhythm, no ST/T wave changes  Time spent: 95 minutes

## 2012-04-12 NOTE — ED Notes (Signed)
Pt to xray

## 2012-04-12 NOTE — Progress Notes (Signed)
ANTIBIOTIC CONSULT NOTE - INITIAL  Pharmacy Consult for Vancomycin  Indication: CAP in ICU setting   Allergies  Allergen Reactions  . Other Other (See Comments)    Medication-NapraPAC (prevacid and naprosyn) Reaction-Hiccups  . Prednisone Other (See Comments)    Hiccups    Patient Measurements:  Adjusted Body Weight:  Vital Signs: Temp: 98.3 F (36.8 C) (03/25 1606) Temp src: Oral (03/25 1606) BP: 113/64 mmHg (03/25 1606) Pulse Rate: 65 (03/25 1606) Intake/Output from previous day:   Intake/Output from this shift: Total I/O In: -  Out: 30 [Urine:30]  Labs:  Recent Labs  04/12/12 1245  WBC 13.3*  HGB 14.6  PLT 140*  CREATININE 1.61*   The CrCl is unknown because both a height and weight (above a minimum accepted value) are required for this calculation. No results found for this basename: VANCOTROUGH, VANCOPEAK, VANCORANDOM, GENTTROUGH, GENTPEAK, GENTRANDOM, TOBRATROUGH, TOBRAPEAK, TOBRARND, AMIKACINPEAK, AMIKACINTROU, AMIKACIN,  in the last 72 hours   Microbiology: No results found for this or any previous visit (from the past 720 hour(s)).  Medical History: Past Medical History  Diagnosis Date  . Hypertension   . Gout   . Hyperlipidemia     Medications:  Scheduled:  . [COMPLETED] acetaminophen  650 mg Oral Once   Infusions:  . azithromycin (ZITHROMAX) 500 MG IVPB 500 mg (04/12/12 1551)  . cefTRIAXone (ROCEPHIN)  IV    . levofloxacin (LEVAQUIN) IV    . [COMPLETED] piperacillin-tazobactam Stopped (04/12/12 1413)  . [COMPLETED] sodium chloride 1,000 mL (04/12/12 1306)  . [COMPLETED] sodium chloride Stopped (04/12/12 1543)  . [COMPLETED] vancomycin Stopped (04/12/12 1543)   PRN:  Assessment:  79 yo M with acute respiratory failure secondary to CAP being admitted to ICU, Starting vancomycin per pharmacy, and Rocephin/Levaquin per MD.    CKD with Scr 1.61 currently elevated above baseline, will follow   Blood cultures sent 3/25   Goal of  Therapy:  Vancomycin trough 15-20   Plan:  1.) Vancomycin 750 mg IV q12h  2.) Adjust Levaquin to 750 mg IV q48h based on CrCl 40 ml/min.  Continue Rocephin 1 gram IV Q24h 3.) Monitor renal function and CBC 4.) Check vancomycin troughs as needed  Bobbi Kozakiewicz, Loma Messing PharmD Pager #: [phone] 4:22 PM 04/12/2012

## 2012-04-12 NOTE — ED Provider Notes (Signed)
History    79 year old male presenting from primary care provider's office for further evaluation of fever and hypotension. Patient began having general male ACE yesterday which is progressively worsening. HE was having some abdominal pain last night associated with vomiting and diarrhea. Those symptoms have resolved aside from nausea. No urinary complaints. Baseline shortness of breath, but has been worse the last day or so. Occasional non-productive cough. No chest pain. No back pain. No unusual rash.  CSN: [id number]  Arrival date & time 04/12/12  1223   First MD Initiated Contact with Patient 04/12/12 1226      Chief Complaint  Patient presents with  . sent from PCP, SOB, weakness     (Consider location/radiation/quality/duration/timing/severity/associated sxs/prior treatment) HPI  Past Medical History  Diagnosis Date  . Hypertension   . Gout   . Hyperlipidemia     Past Surgical History  Procedure Laterality Date  . Cardiac catheterization    . Skin graft      rt arm    Family History  Problem Relation Age of Onset  . Diabetes type II Father   . Prostate cancer Father   . Prostate cancer Brother   . Throat cancer Brother     smoked    History  Substance Use Topics  . Smoking status: Former Smoker -- 1.00 packs/day for 40 years    Types: Cigarettes    Quit date: 01/19/1998  . Smokeless tobacco: Never Used  . Alcohol Use: No      Review of Systems  All systems reviewed and negative, other than as noted in HPI.   Allergies  Other and Prednisone  Home Medications   Current Outpatient Rx  Name  Route  Sig  Dispense  Refill  . aspirin EC 81 MG tablet   Oral   Take 81 mg by mouth every morning.          Marland Kitchen atenolol (TENORMIN) 25 MG tablet   Oral   Take 12.5 mg by mouth every morning.         . calcium carbonate (TUMS - DOSED IN MG ELEMENTAL CALCIUM) 500 MG chewable tablet      Per bottle as needed for heartburn         . Calcium  Carbonate-Vitamin D (CALCIUM 600 + D PO)   Oral   Take 1 tablet by mouth at bedtime.         . colchicine 0.6 MG tablet   Oral   Take 0.6 mg by mouth at bedtime.          . famotidine (PEPCID) 20 MG tablet   Oral   Take 20 mg by mouth at bedtime. One at bedtime         . Homeopathic Products ([employer] LEG CRAMPS PAIN RELIEF PO)   Oral   Take 1 tablet by mouth daily as needed.         . methylPREDNISolone (MEDROL) 16 MG tablet   Oral   Take 16 mg by mouth every morning. Take 2 tablets daily until better then one daily         . Multiple Vitamins-Minerals (MULTIVITAMINS THER. W/MINERALS) TABS   Oral   Take 1 tablet by mouth every morning.          . pantoprazole (PROTONIX) 40 MG tablet      TAKE ONE TABLET BY MOUTH ONE TIME DAILY 30 TO 60 MINUTES BEFORE FIRST MEAL OF THE DAY   30 tablet   5   .  quiNINE (QUALAQUIN) 324 MG capsule   Oral   Take 648 mg by mouth every 8 (eight) hours.         . simethicone (MYLICON) 125 MG chewable tablet   Oral   Chew by mouth. Per bottle as needed for gas/bloating         . Tamsulosin HCl (FLOMAX) 0.4 MG CAPS   Oral   Take 0.4 mg by mouth at bedtime.            BP 105/66  Pulse 122  Temp(Src) 102.7 F (39.3 C) (Rectal)  Resp 24  SpO2 93%  Physical Exam  Nursing note and vitals reviewed. Constitutional: He appears well-developed and well-nourished.  Sitting in bed. Tired appearing.  HENT:  Head: Normocephalic and atraumatic.  Eyes: Conjunctivae are normal. Pupils are equal, round, and reactive to light. Right eye exhibits no discharge. Left eye exhibits no discharge.  Neck: Neck supple.  Cardiovascular: Regular rhythm and normal heart sounds.  Exam reveals no gallop and no friction rub.   No murmur heard. Tachycardic with regular rhythm. Sinus on monitor.  Pulmonary/Chest: Effort normal and breath sounds normal. No respiratory distress.  Abdominal: Soft. He exhibits no distension and no mass. There is no  tenderness. There is no rebound.  Musculoskeletal: He exhibits no edema and no tenderness.  Lower extremities symmetric as compared to each other. No calf tenderness. Negative Homan's. No palpable cords.   Neurological: He is alert.  Skin: Skin is warm and dry.  Psychiatric: He has a normal mood and affect. His behavior is normal. Thought content normal.    ED Course  Procedures (including critical care time)  CRITICAL CARE Performed by: Raeford Razor   Total critical care time: 40 minutes  Critical care time was exclusive of separately billable procedures and treating other patients.  Critical care was necessary to treat or prevent imminent or life-threatening deterioration.  Critical care was time spent personally by me on the following activities: development of treatment plan with patient and/or surrogate as well as nursing, discussions with consultants, evaluation of patient's response to treatment, examination of patient, obtaining history from patient or surrogate, ordering and performing treatments and interventions, ordering and review of laboratory studies, ordering and review of radiographic studies, pulse oximetry and re-evaluation of patient's condition.   Labs Reviewed  CBC - Abnormal; Notable for the following:    WBC 13.3 (*)    HCT 54.3 (*)    MCV 118.6 (*)    MCHC 26.9 (*)    RDW 16.3 (*)    Platelets 140 (*)    All other components within normal limits  BASIC METABOLIC PANEL - Abnormal; Notable for the following:    Glucose, Bld 140 (*)    BUN 30 (*)    Creatinine, Ser 1.61 (*)    GFR calc non Af Amer 40 (*)    GFR calc Af Amer 46 (*)    All other components within normal limits  URINALYSIS, ROUTINE W REFLEX MICROSCOPIC - Abnormal; Notable for the following:    Color, Urine AMBER (*)    APPearance CLOUDY (*)    Specific Gravity, Urine 1.034 (*)    Bilirubin Urine SMALL (*)    All other components within normal limits  CG4 I-STAT (LACTIC ACID) -  Abnormal; Notable for the following:    Lactic Acid, Venous 7.12 (*)    All other components within normal limits  CULTURE, BLOOD (ROUTINE X 2)  CULTURE, BLOOD (ROUTINE X 2)  Dg Chest 2 View  04/12/2012  *RADIOLOGY REPORT*  Clinical Data: Fever, dyspnea  CHEST - 2 VIEW  Comparison: 02/24/1936  Findings: The cardiomediastinal silhouette is stable.  Mild elevation of the left hemidiaphragm again noted.  Stable chronic interstitial prominence.  No pulmonary edema.  There is a small amount of bilateral basilar atelectasis, scarring or early infiltrate.  IMPRESSION: No pulmonary edema.  Small bilateral basilar atelectasis, scarring or early infiltrate.   Original Report Authenticated By: Natasha Mead, M.D.     1. Sepsis   2. CAP (community acquired pneumonia)       MDM   79 year old male with generalized weakness and fever. Markedly elevated lactate. Hypotensive in PCP's office. Sinus tachycardia 120-130. Fluid bolus. Mild hypoxia. Concern for sepsis and antibiotics empirically started.    CXR with questionable pneumonia versus atelectasis. Given increased dyspnea, may be infiltrate. Azithromycin added for atypical coverage. Will discuss with hospitalist for admission.   Raeford Razor, MD 04/12/12 1524

## 2012-04-13 LAB — COMPREHENSIVE METABOLIC PANEL
BUN: 24 mg/dL — ABNORMAL HIGH (ref 6–23)
CO2: 28 mEq/L (ref 19–32)
Chloride: 103 mEq/L (ref 96–112)
Creatinine, Ser: 1.27 mg/dL (ref 0.50–1.35)
GFR calc Af Amer: 61 mL/min — ABNORMAL LOW (ref >90)
GFR calc non Af Amer: 53 mL/min — ABNORMAL LOW (ref >90)
Glucose, Bld: 144 mg/dL — ABNORMAL HIGH (ref 70–99)
Total Bilirubin: 1 mg/dL (ref 0.3–1.2)

## 2012-04-13 LAB — CBC
Platelets: 122 10*3/uL — ABNORMAL LOW (ref 150–400)
RBC: 3.99 MIL/uL — ABNORMAL LOW (ref 4.22–5.81)
WBC: 8.9 10*3/uL (ref 4.0–10.5)

## 2012-04-13 LAB — LEGIONELLA ANTIGEN, URINE

## 2012-04-13 LAB — GLUCOSE, CAPILLARY: Glucose-Capillary: 97 mg/dL (ref 70–99)

## 2012-04-13 LAB — URINE CULTURE: Culture: NO GROWTH

## 2012-04-13 MED ORDER — LEVOFLOXACIN IN D5W 750 MG/150ML IV SOLN
750.0000 mg | INTRAVENOUS | Status: DC
  Administered 2012-04-13 – 2012-04-14 (×2): 750 mg via INTRAVENOUS
  Filled 2012-04-13 (×2): qty 150

## 2012-04-13 NOTE — Evaluation (Signed)
Physical Therapy Evaluation Patient Details Name: James Pope MRN: [id number] DOB: [birth year] Today's Date: 04/13/2012 Time: 4098-1191 PT Time Calculation (min): 16 min  PT Assessment / Plan / Recommendation Clinical Impression  Pt. is 79 y/o male admitted with Hypotension, sepsis, CAP, N/VD.Marland Kitchen Pt. has h/o interstitial lung disease. Pt. was able to mobilize to edge of bed and walk in room. Pt. should improve to return to baseline of being independent.. Continue PT while in acute care. May not need HHPT. Will determine closer to DC.    PT Assessment  Patient needs continued PT services    Follow Up Recommendations  Home health PT    Does the patient have the potential to tolerate intense rehabilitation      Barriers to Discharge        Equipment Recommendations  None recommended by PT    Recommendations for Other Services     Frequency Min 3X/week    Precautions / Restrictions Precautions Precautions: Fall Precaution Comments: hypotension   Pertinent Vitals/Pain VSS during mobility.      Mobility  Bed Mobility Bed Mobility: Supine to Sit Supine to Sit: 5: Supervision Transfers Transfers: Sit to Stand;Stand to Sit Sit to Stand: 4: Min assist Stand to Sit: To chair/3-in-1;4: Min guard Ambulation/Gait Ambulation/Gait Assistance: 4: Min assist Ambulation Distance (Feet): 20 Feet Assistive device: Rolling walker Ambulation/Gait Assistance Details: some assistance for remaining clear of lines. Gait Pattern: Step-to pattern    Exercises     PT Diagnosis: Difficulty walking;Generalized weakness  PT Problem List: Decreased activity tolerance;Decreased mobility;Decreased safety awareness;Decreased knowledge of precautions PT Treatment Interventions: DME instruction;Gait training;Stair training;Functional mobility training;Therapeutic exercise;Therapeutic activities;Patient/family education   PT Goals Acute Rehab PT Goals PT Goal Formulation: With patient Time For  Goal Achievement: 04/27/12 Potential to Achieve Goals: Good Pt will go Sit to Stand: Independently PT Goal: Sit to Stand - Progress: Goal set today Pt will go Stand to Sit: Independently PT Goal: Stand to Sit - Progress: Goal set today Pt will Ambulate: 51 - 150 feet;Independently;with least restrictive assistive device PT Goal: Ambulate - Progress: Goal set today Pt will Go Up / Down Stairs: 3-5 stairs;with supervision;with rail(s) PT Goal: Up/Down Stairs - Progress: Goal set today  Visit Information  Last PT Received On: 04/13/12 Assistance Needed: +2    Subjective Data  Subjective: I am hungry Patient Stated Goal: to walk, go home   Prior Functioning  Home Living Lives With: Spouse Available Help at Discharge: Family Type of Home: House Home Access: Stairs to enter Secretary/administrator of Steps: 4 Entrance Stairs-Rails: Right;Left Home Layout: One level Firefighter: Standard Home Adaptive Equipment: None Prior Function Level of Independence: Independent Able to Take Stairs?: Yes Driving: Yes Communication Communication: No difficulties    Cognition  Cognition Overall Cognitive Status: Appears within functional limits for tasks assessed/performed Arousal/Alertness: Awake/alert Orientation Level: Appears intact for tasks assessed Behavior During Session: [facility] for tasks performed    Extremity/Trunk Assessment Right Upper Extremity Assessment RUE ROM/Strength/Tone: Within functional levels Right Lower Extremity Assessment RLE ROM/Strength/Tone: Within functional levels Left Lower Extremity Assessment LLE ROM/Strength/Tone: Within functional levels   Balance Balance Balance Assessed: Yes Static Standing Balance Static Standing - Balance Support: No upper extremity supported Static Standing - Level of Assistance: 5: Stand by assistance Static Standing - Comment/# of Minutes: able to stand with no assistance.  End of Session PT - End of Session Activity  Tolerance: Patient tolerated treatment well Patient left: in chair;with call bell/phone within reach Nurse  Communication: Mobility status  GP     Rada Hay 04/13/2012, 11:24 AM  Blanchard Kelch PT [phone]

## 2012-04-13 NOTE — Progress Notes (Signed)
[facility] PROGRESS NOTE  James Pope ZOX:[id number] DOB: 1932-12-27 DOA: 04/12/2012 PCP: Laurena Slimmer, MD  Brief narrative: 79 year old male with past medical history of pulmonary fibrosis, Hypertension, gout, dyslipidemia who presented from primary care office to [facility] ED 04/12/12 for evaluation of fever and cough. Patient has reported progressively worsening shortness of breath associated with non productive cough, fever and generalized weakness.  In ED, evaluation included CXR which showed bibasilar atelectasis, scarring and early infiltrates. Patient was febrile with Tmax = 102.7 F. His BP was 105/66 with HR of 124. His O2 saturation was 93% on 4 L nasal canula. His CBC revealed leukocytosis of 13.3. His creatinine was 1.61 on admission.   Assessment and Plan:   Principal Problem:  *Acute respiratory failure with hypoxia  Secondary to community acquired pneumonia.  Continue albuterol and atrovent Q 4 hours PRN shortness of breath or wheezing.  Patient started on empiric antibiotics in ED. we will discontinue Rocephin and leave the Levaquin and vancomycin for now. Followup the results of blood culture, respiratory culture. Followup flu test, legionella and strep pneumoniae are all negative. Procalcitonin level was 3.33 on admission Patient tolerates regular diet so we will discontinue IV fluids. Active Problems:  Sepsis  Secondary to pneumonia. Followup lactic acid in the morning. Follow up procalcitonin level. Lactic acid is 7.1 on admission.  Continue vancomycin and Levaquin. Discontinue Rocephin. CAP (community acquired pneumonia)  Management as above HTN (hypertension)  Continue atenolol 12.5 mg daily CKD (chronic kidney disease)  Creatinine in past (07/2011) 1.43 and on this admission 1.61  Creatinine is now within normal limits. Resolved with IV fluids. Leukocytosis  Due to steroid patient is taking at home for history of pulmonary fibrosis and due to  pneumonia Gout  Continue colchicine   Code Status: Full  Family Communication: Family not at bedside Disposition Plan: Home when stable   Manson Passey, MD  [facility]  Pager [phone]   If 7PM-7AM, please contact night-coverage [email or web address] Password [facility] 04/13/2012, 6:52 AM   LOS: 1 day   Consultants:  None  Procedures:  None  Antibiotics:  Rocephin 04/12/2012 --> 04/13/2012  Levaquin 04/12/2012 -->  Vancomycin 04/12/2012 -->  HPI/Subjective: No acute overnight events  Objective: Filed Vitals:   04/13/12 0300 04/13/12 0400 04/13/12 0446 04/13/12 0500  BP: 135/79   113/47  Pulse: 71   75  Temp:  99.6 F (37.6 C)    TempSrc:  Oral    Resp: 19   21  Height:      Weight:   88 kg (194 lb 0.1 oz)   SpO2: 92%   92%    Intake/Output Summary (Last 24 hours) at 04/13/12 8295 Last data filed at 04/13/12 0207  Gross per 24 hour  Intake    970 ml  Output    880 ml  Net     90 ml    Exam:   General:  Pt is alert, follows commands appropriately, not in acute distress  Cardiovascular: Regular rate and rhythm, S1/S2, no murmurs, no rubs, no gallops  Respiratory: Clear to auscultation bilaterally, no wheezing, no crackles, no rhonchi  Abdomen: Soft, non tender, non distended, bowel sounds present, no guarding  Extremities: No edema, pulses DP and PT palpable bilaterally  Neuro: Grossly nonfocal  Data Reviewed: Basic Metabolic Panel:  Recent Labs Lab 04/12/12 1245 04/12/12 1731 04/13/12 0305  NA 139 139 138  K 3.6 3.6 3.8  CL 98 102 103  CO2 25 25 28  GLUCOSE 140* 134* 144*  BUN 30* 28* 24*  CREATININE 1.61* 1.58* 1.27  CALCIUM 9.9 8.4 8.6  MG  --  1.8  --   PHOS  --  4.4  --    Liver Function Tests:  Recent Labs Lab 04/12/12 1731 04/13/12 0305  AST 36 43*  ALT 24 27  ALKPHOS 42 43  BILITOT 1.0 1.0  PROT 6.0 6.0  ALBUMIN 2.5* 2.7*   No results found for this basename: LIPASE, AMYLASE,  in the last 168 hours No results found for this  basename: AMMONIA,  in the last 168 hours CBC:  Recent Labs Lab 04/12/12 1245 04/12/12 1731 04/13/12 0305  WBC 13.3* 9.9 8.9  NEUTROABS  --  8.4*  --   HGB 14.6 13.1 12.7*  HCT 54.3* 38.5* 37.3*  MCV 118.6* 94.4 93.5  PLT 140* 125* 122*   Cardiac Enzymes: No results found for this basename: CKTOTAL, CKMB, CKMBINDEX, TROPONINI,  in the last 168 hours BNP: No components found with this basename: POCBNP,  CBG: No results found for this basename: GLUCAP,  in the last 168 hours  Recent Results (from the past 240 hour(s))  MRSA PCR SCREENING     Status: None   Collection Time    04/12/12  5:06 PM      Result Value Range Status   MRSA by PCR NEGATIVE  NEGATIVE Final   Comment:            The GeneXpert MRSA Assay (FDA     approved for NASAL specimens     only), is one component of a     comprehensive MRSA colonization     surveillance program. It is not     intended to diagnose MRSA     infection nor to guide or     monitor treatment for     MRSA infections.     Studies: Dg Chest 2 View  04/12/2012  *RADIOLOGY REPORT*  Clinical Data: Fever, dyspnea  CHEST - 2 VIEW  Comparison: 02/24/1938  Findings: The cardiomediastinal silhouette is stable.  Mild elevation of the left hemidiaphragm again noted.  Stable chronic interstitial prominence.  No pulmonary edema.  There is a small amount of bilateral basilar atelectasis, scarring or early infiltrate.  IMPRESSION: No pulmonary edema.  Small bilateral basilar atelectasis, scarring or early infiltrate.   Original Report Authenticated By: Natasha Mead, M.D.     Scheduled Meds: . aspirin EC  81 mg Oral q morning - 10a  . atenolol  12.5 mg Oral q morning - 10a  . cefTRIAXone (ROCEPHIN)  IV  1 g Intravenous Q24H  . colchicine  0.6 mg Oral QHS  . famotidine  20 mg Oral QHS  . [START ON 04/14/2012] levofloxacin (LEVAQUIN) IV  750 mg Intravenous Q48H  . methylPREDNISolone  16 mg Oral q morning - 10a  . multivitamin with minerals  1 tablet  Oral q morning - 10a  . pantoprazole  40 mg Oral Daily  . tamsulosin  0.4 mg Oral QHS  . vancomycin  750 mg Intravenous Q12H   Continuous Infusions: . sodium chloride 75 mL/hr at 04/13/12 0200

## 2012-04-13 NOTE — Progress Notes (Signed)
ANTIBIOTIC CONSULT NOTE - Follow-up  Pharmacy Consult for Vancomycin (Pharmacy may adjust abx) Indication: CAP in ICU setting   Allergies  Allergen Reactions  . Other Other (See Comments)    Medication-NapraPAC (prevacid and naprosyn) Reaction-Hiccups  . Prednisone Other (See Comments)    Hiccups    Patient Measurements:  Adjusted Body Weight:  Vital Signs: Temp: 98.4 F (36.9 C) (03/26 0800) Temp src: Oral (03/26 0800) BP: 117/41 mmHg (03/26 0700) Pulse Rate: 69 (03/26 0700) Intake/Output from previous day: 03/25 0701 - 03/26 0700 In: 1270 [I.V.:1120; IV Piggyback:150] Out: 1181 [Urine:1180; Stool:1] Intake/Output from this shift:    Labs:  Recent Labs  04/12/12 1245 04/12/12 1731 04/13/12 0305  WBC 13.3* 9.9 8.9  HGB 14.6 13.1 12.7*  PLT 140* 125* 122*  CREATININE 1.61* 1.58* 1.27   Estimated Creatinine Clearance: 51.7 ml/min (by C-G formula based on Cr of 1.27). No results found for this basename: VANCOTROUGH, Leodis Binet, VANCORANDOM, GENTTROUGH, GENTPEAK, GENTRANDOM, TOBRATROUGH, TOBRAPEAK, TOBRARND, AMIKACINPEAK, AMIKACINTROU, AMIKACIN,  in the last 72 hours   Microbiology: Recent Results (from the past 720 hour(s))  CULTURE, BLOOD (ROUTINE X 2)     Status: None   Collection Time    04/12/12 12:45 PM      Result Value Range Status   Specimen Description BLOOD RIGHT ANTECUBITAL   Final   Special Requests BOTTLES DRAWN AEROBIC AND ANAEROBIC   Final   Culture  Setup Time 04/12/2012 17:37   Final   Culture     Final   Value:        BLOOD CULTURE RECEIVED NO GROWTH TO DATE CULTURE WILL BE HELD FOR 5 DAYS BEFORE ISSUING A FINAL NEGATIVE REPORT   Report Status PENDING   Incomplete  CULTURE, BLOOD (ROUTINE X 2)     Status: None   Collection Time    04/12/12 12:50 PM      Result Value Range Status   Specimen Description BLOOD RIGHT FOREARM   Final   Special Requests BOTTLES DRAWN AEROBIC AND ANAEROBIC   Final   Culture  Setup Time 04/12/2012 17:37    Final   Culture     Final   Value:        BLOOD CULTURE RECEIVED NO GROWTH TO DATE CULTURE WILL BE HELD FOR 5 DAYS BEFORE ISSUING A FINAL NEGATIVE REPORT   Report Status PENDING   Incomplete  MRSA PCR SCREENING     Status: None   Collection Time    04/12/12  5:06 PM      Result Value Range Status   MRSA by PCR NEGATIVE  NEGATIVE Final   Comment:            The GeneXpert MRSA Assay (FDA     approved for NASAL specimens     only), is one component of a     comprehensive MRSA colonization     surveillance program. It is not     intended to diagnose MRSA     infection nor to guide or     monitor treatment for     MRSA infections.    Medical History: Past Medical History  Diagnosis Date  . Hypertension   . Gout   . Hyperlipidemia     Medications:  Scheduled:  . [COMPLETED] acetaminophen  650 mg Oral Once  . aspirin EC  81 mg Oral q morning - 10a  . atenolol  12.5 mg Oral q morning - 10a  . [COMPLETED] azithromycin (ZITHROMAX) 500  MG IVPB  500 mg Intravenous Once  . cefTRIAXone (ROCEPHIN)  IV  1 g Intravenous Q24H  . colchicine  0.6 mg Oral QHS  . famotidine  20 mg Oral QHS  . levofloxacin (LEVAQUIN) IV  750 mg Intravenous Q24H  . methylPREDNISolone  16 mg Oral q morning - 10a  . multivitamin with minerals  1 tablet Oral q morning - 10a  . pantoprazole  40 mg Oral Daily  . [COMPLETED] piperacillin-tazobactam  3.375 g Intravenous Once  . [COMPLETED] sodium chloride  1,000 mL Intravenous Once  . [COMPLETED] sodium chloride  1,000 mL Intravenous Once  . tamsulosin  0.4 mg Oral QHS  . vancomycin  750 mg Intravenous Q12H  . [COMPLETED] vancomycin  1,000 mg Intravenous Once  . [DISCONTINUED] levofloxacin (LEVAQUIN) IV  750 mg Intravenous Q24H  . [DISCONTINUED] levofloxacin (LEVAQUIN) IV  750 mg Intravenous Q48H  . [DISCONTINUED] quiNINE  648 mg Oral Q8H   Infusions:  . sodium chloride 75 mL/hr at 04/13/12 0600  . [DISCONTINUED] sodium chloride Stopped (04/12/12 1807)    PRN:  Assessment:  79 yo M with acute respiratory failure secondary to CAP being admitted to ICU, Starting vancomycin per pharmacy, and Rocephin/Levaquin   3/25 >> Vancomycin >>  3/25 >> Rocephin >>  3/25 >> Levaquin >>   WBC: 8.9 Renal: SCr = 1.27 (improved), CKD (baseline 1.43?) - elevated at admission =1.61 Temp: 102.7 PCT: 3.33 (3/25)  Micro: 3/25 Blood x 2: NGTD 3/25 Urine: pending 3/25: flu panel: negative 3/25: s. pneumo Ag: negative 3/25: legionella Ag:   Goal of Therapy:  Vancomycin trough 15-20   Plan:  1.) Continue Vancomycin 750 mg IV q12h  2.) Adjust Levaquin to 750 mg IV q24h based on CrCl now > 33ml/min. Continue Rocephin 1 gram IV Q24h 3.) Check vancomycin troughs as needed  Juliette Alcide, PharmD, BCPS.   Pager: [phone] 9:00 AM 04/13/2012

## 2012-04-13 NOTE — Progress Notes (Signed)
James Pope 1227 TRANSFERRED TO 1345- ON RA IN [facility]. W/ NT. REPORT GIVEN TO CINDY RN. NO CHANGE IN CONDITION.

## 2012-04-13 NOTE — Progress Notes (Signed)
CARE MANAGEMENT NOTE 04/13/2012  Patient:  James Pope, James Pope   Account Number:  [id number]  Date Initiated:  04/13/2012  Documentation initiated by:  Dudley Mages  Subjective/Objective Assessment:   patient admitted with pna versus sepsis.temp 102.7     Action/Plan:   from home is normally independent, will follow for needs   Anticipated DC Date:  04/16/2012   Anticipated DC Plan:  HOME/SELF CARE  In-house referral  NA  NA      DC Planning Services  NA      PAC Choice  NA   Choice offered to / List presented to:  NA   DME arranged  NA      DME agency  NA     HH arranged  NA      HH agency  NA   Status of service:  In process, will continue to follow Medicare Important Message given?  NA - LOS <3 / Initial given by admissions (If response is "NO", the following Medicare IM given date fields will be blank) Date Medicare IM given:   Date Additional Medicare IM given:    Discharge Disposition:    Per UR Regulation:  Reviewed for med. necessity/level of care/duration of stay  If discussed at Long Length of Stay Meetings, dates discussed:    Comments:  [id number]/Makinzy Cleere Earlene Plater, RN, BSN, CCM:  CHART REVIEWED AND UPDATED.  Next chart review due on [id number]. NO DISCHARGE NEEDS PRESENT AT THIS TIME. CASE MANAGEMENT [phone]

## 2012-04-14 DIAGNOSIS — M109 Gout, unspecified: Secondary | ICD-10-CM

## 2012-04-14 LAB — BASIC METABOLIC PANEL
CO2: 31 mEq/L (ref 19–32)
Calcium: 9 mg/dL (ref 8.4–10.5)
GFR calc non Af Amer: 48 mL/min — ABNORMAL LOW (ref >90)
Glucose, Bld: 141 mg/dL — ABNORMAL HIGH (ref 70–99)
Potassium: 4.3 mEq/L (ref 3.5–5.1)
Sodium: 138 mEq/L (ref 135–145)

## 2012-04-14 LAB — CBC
Hemoglobin: 11.9 g/dL — ABNORMAL LOW (ref 13.0–17.0)
MCH: 30.8 pg (ref 26.0–34.0)
MCV: 94 fL (ref 78.0–100.0)
Platelets: 115 10*3/uL — ABNORMAL LOW (ref 150–400)
RBC: 3.86 MIL/uL — ABNORMAL LOW (ref 4.22–5.81)
WBC: 7.3 10*3/uL (ref 4.0–10.5)

## 2012-04-14 LAB — LACTIC ACID, PLASMA: Lactic Acid, Venous: 1.7 mmol/L (ref 0.5–2.2)

## 2012-04-14 LAB — PROCALCITONIN: Procalcitonin: 0.87 ng/mL

## 2012-04-14 MED ORDER — HYDROCODONE-ACETAMINOPHEN 5-325 MG PO TABS: 1.0000 | ORAL_TABLET | ORAL | Status: DC | PRN

## 2012-04-14 MED ORDER — LEVOFLOXACIN 750 MG PO TABS: 750.0000 mg | ORAL_TABLET | Freq: Every day | ORAL | Status: DC

## 2012-04-14 NOTE — Discharge Summary (Signed)
Physician Discharge Summary  James Pope ZOX:[id number] DOB: 26-Jul-1932 DOA: 04/12/2012  PCP: Laurena Slimmer, MD  Admit date: 04/12/2012 Discharge date: 04/14/2012  Recommendations for Outpatient Follow-up:  1. Follow up with PCP in 1-2 weeks post discharge or sooner if symptoms resolve  Discharge Diagnoses:  Principal Problem:   Acute respiratory failure with hypoxia Active Problems:   Sepsis   CAP (community acquired pneumonia)   HTN (hypertension)   CKD (chronic kidney disease)   Leukocytosis   Hyperlipidemia   Gout  Discharge Condition: medically stable for discharge home today  Diet recommendation: as tolerated  History of present illness:  79 year old male with past medical history of pulmonary fibrosis, Hypertension, gout, dyslipidemia who presented from primary care office to [facility] ED 04/12/12 for evaluation of fever and cough. Patient has reported progressively worsening shortness of breath associated with non productive cough, fever and generalized weakness.  In ED, evaluation included CXR which showed bibasilar atelectasis, scarring and early infiltrates. Patient was febrile with Tmax = 102.7 F. His BP was 105/66 with HR of 124. His O2 saturation was 93% on 4 L nasal canula. His CBC revealed leukocytosis of 13.3. His creatinine was 1.61 on admission.   Assessment and Plan:   Principal Problem:  *Acute respiratory failure with hypoxia  Secondary to community acquired pneumonia.  Continue Levaquin for 10 days on discharge.  Followup the results of blood culture, respiratory culture are all negative. Followup flu test, legionella and strep pneumoniae are all negative.  Procalcitonin level was 3.33 on admission  Patient tolerated regular diet Active Problems:  Sepsis  Secondary to pneumonia.  Lactic acid is 7.1 on admission. Lactic acid today 1.7, WNL. Procalcitonin mildly elevated at 3.33 CAP (community acquired pneumonia)  Management as above HTN (hypertension)   Continue atenolol 12.5 mg daily CKD (chronic kidney disease)  Creatinine in past (07/2011) 1.43 and on this admission 1.61  Creatinine is now within normal limits. Resolved with IV fluids. Leukocytosis  Due to steroid patient is taking at home for history of pulmonary fibrosis and due to pneumonia Gout  Continue colchicine   Code Status: Full  Family Communication: Family not at bedside  Disposition Plan: Home today  Manson Passey, MD  [facility]  Pager [phone]   Consultants:  None Procedures:  None Antibiotics:  Rocephin 04/12/2012 --> 04/13/2012  Levaquin 04/12/2012 --> to continue for    Discharge Exam: Filed Vitals:   04/14/12 0539  BP: 121/72  Pulse: 69  Temp: 98.3 F (36.8 C)  Resp: 18   Filed Vitals:   04/13/12 1028 04/13/12 1400 04/13/12 2147 04/14/12 0539  BP: 117/81 110/81 128/76 121/72  Pulse: 82 82 74 69  Temp: 98 F (36.7 C) 97.8 F (36.6 C) 98.6 F (37 C) 98.3 F (36.8 C)  TempSrc: Oral Oral Oral Oral  Resp: 18 18 16 18   Height: 5\' 11"  (1.803 m)     Weight: 86.3 kg (190 lb 4.1 oz)   87 kg (191 lb 12.8 oz)  SpO2: 94% 96% 97% 100%    General: Pt is alert, follows commands appropriately, not in acute distress Cardiovascular: Regular rate and rhythm, S1/S2 +, no murmurs, no rubs, no gallops Respiratory: Clear to auscultation bilaterally, no wheezing, no crackles, no rhonchi Abdominal: Soft, non tender, non distended, bowel sounds +, no guarding Extremities: no edema, no cyanosis, pulses palpable bilaterally DP and PT Neuro: Grossly nonfocal  Discharge Instructions  Discharge Orders   Future Appointments Provider Department Dept Phone  04/19/2012 8:00 AM [facility] [address] [phone]   04/28/2012 9:00 AM Julio Sicks, NP [facility] [phone]   Future Orders Complete By Expires     Call MD for:  difficulty breathing, headache or visual disturbances  As directed     Call MD for:   persistant dizziness or light-headedness  As directed     Call MD for:  persistant nausea and vomiting  As directed     Call MD for:  severe uncontrolled pain  As directed     Diet - low sodium heart healthy  As directed     Increase activity slowly  As directed         Medication List    TAKE these medications       aspirin EC 81 MG tablet  Take 81 mg by mouth every morning.     atenolol 25 MG tablet  Commonly known as:  TENORMIN  Take 12.5 mg by mouth every morning.     CALCIUM 600 + D PO  Take 1 tablet by mouth at bedtime.     calcium carbonate 500 MG chewable tablet  Commonly known as:  TUMS - dosed in mg elemental calcium  Per bottle as needed for heartburn     colchicine 0.6 MG tablet  Take 0.6 mg by mouth at bedtime.     [employer] LEG CRAMPS PAIN RELIEF PO  Take 1 tablet by mouth daily as needed.     famotidine 20 MG tablet  Commonly known as:  PEPCID  Take 20 mg by mouth at bedtime. One at bedtime     HYDROcodone-acetaminophen 5-325 MG per tablet  Commonly known as:  NORCO/VICODIN  Take 1-2 tablets by mouth every 4 (four) hours as needed.     levofloxacin 750 MG tablet  Commonly known as:  LEVAQUIN  Take 1 tablet (750 mg total) by mouth daily.  Start taking on:  04/15/2012     methylPREDNISolone 16 MG tablet  Commonly known as:  MEDROL  Take 16 mg by mouth every morning. Take 2 tablets daily until better then one daily     multivitamins ther. w/minerals Tabs  Take 1 tablet by mouth every morning.     pantoprazole 40 MG tablet  Commonly known as:  PROTONIX  TAKE ONE TABLET BY MOUTH ONE TIME DAILY 30 TO 60 MINUTES BEFORE FIRST MEAL OF THE DAY     simethicone 125 MG chewable tablet  Commonly known as:  MYLICON  Chew by mouth. Per bottle as needed for gas/bloating     tamsulosin 0.4 MG Caps  Commonly known as:  FLOMAX  Take 0.4 mg by mouth at bedtime.           Follow-up Information   Follow up with Laurena Slimmer, MD In 2 weeks.   Contact  information:   [address], [address] [place] [address] [phone]        The results of significant diagnostics from this hospitalization (including imaging, microbiology, ancillary and laboratory) are listed below for reference.    Significant Diagnostic Studies: Dg Chest 2 View  04/12/2012  *RADIOLOGY REPORT*  Clinical Data: Fever, dyspnea  CHEST - 2 VIEW  Comparison: 02/24/1938  Findings: The cardiomediastinal silhouette is stable.  Mild elevation of the left hemidiaphragm again noted.  Stable chronic interstitial prominence.  No pulmonary edema.  There is a small amount of bilateral basilar atelectasis, scarring or early infiltrate.  IMPRESSION: No pulmonary edema.  Small bilateral basilar atelectasis, scarring or early infiltrate.   Original Report Authenticated By: Natasha Mead, M.D.     Microbiology: Recent Results (from the past 240 hour(s))  CULTURE, BLOOD (ROUTINE X 2)     Status: None   Collection Time    04/12/12 12:45 PM      Result Value Range Status   Specimen Description BLOOD RIGHT ANTECUBITAL   Final   Special Requests BOTTLES DRAWN AEROBIC AND ANAEROBIC   Final   Culture  Setup Time 04/12/2012 17:37   Final   Culture     Final   Value:        BLOOD CULTURE RECEIVED NO GROWTH TO DATE CULTURE WILL BE HELD FOR 5 DAYS BEFORE ISSUING A FINAL NEGATIVE REPORT   Report Status PENDING   Incomplete  CULTURE, BLOOD (ROUTINE X 2)     Status: None   Collection Time    04/12/12 12:50 PM      Result Value Range Status   Specimen Description BLOOD RIGHT FOREARM   Final   Special Requests BOTTLES DRAWN AEROBIC AND ANAEROBIC   Final   Culture  Setup Time 04/12/2012 17:37   Final   Culture     Final   Value:        BLOOD CULTURE RECEIVED NO GROWTH TO DATE CULTURE WILL BE HELD FOR 5 DAYS BEFORE ISSUING A FINAL NEGATIVE REPORT   Report Status PENDING   Incomplete  MRSA PCR SCREENING     Status: None   Collection Time     04/12/12  5:06 PM      Result Value Range Status   MRSA by PCR NEGATIVE  NEGATIVE Final   Comment:            The GeneXpert MRSA Assay (FDA     approved for NASAL specimens     only), is one component of a     comprehensive MRSA colonization     surveillance program. It is not     intended to diagnose MRSA     infection nor to guide or     monitor treatment for     MRSA infections.  URINE CULTURE     Status: None   Collection Time    04/12/12  8:40 PM      Result Value Range Status   Specimen Description URINE, CLEAN CATCH   Final   Special Requests NONE   Final   Culture  Setup Time 04/13/2012 02:20   Final   Colony Count NO GROWTH   Final   Culture NO GROWTH   Final   Report Status 04/13/2012 FINAL   Final     Labs: Basic Metabolic Panel:  Recent Labs Lab 04/12/12 1245 04/12/12 1731 04/13/12 0305 04/14/12 0349  NA 139 139 138 138  K 3.6 3.6 3.8 4.3  CL 98 102 103 103  CO2 25 25 28 31   GLUCOSE 140* 134* 144* 141*  BUN 30* 28* 24* 23  CREATININE 1.61* 1.58* 1.27 1.38*  CALCIUM 9.9 8.4 8.6 9.0  MG  --  1.8  --   --   PHOS  --  4.4  --   --    Liver Function Tests:  Recent Labs Lab 04/12/12 1731 04/13/12 0305  AST 36 43*  ALT 24 27  ALKPHOS 42 43  BILITOT 1.0 1.0  PROT 6.0 6.0  ALBUMIN 2.5* 2.7*   No results found for this basename:  LIPASE, AMYLASE,  in the last 168 hours No results found for this basename: AMMONIA,  in the last 168 hours CBC:  Recent Labs Lab 04/12/12 1245 04/12/12 1731 04/13/12 0305 04/14/12 0349  WBC 13.3* 9.9 8.9 7.3  NEUTROABS  --  8.4*  --   --   HGB 14.6 13.1 12.7* 11.9*  HCT 54.3* 38.5* 37.3* 36.3*  MCV 118.6* 94.4 93.5 94.0  PLT 140* 125* 122* 115*   Cardiac Enzymes: No results found for this basename: CKTOTAL, CKMB, CKMBINDEX, TROPONINI,  in the last 168 hours BNP: BNP (last 3 results) No results found for this basename: PROBNP,  in the last 8760 hours CBG:  Recent Labs Lab 04/13/12 0758 04/14/12 0752   GLUCAP 97 91    Time coordinating discharge: Over 30 minutes  Signed:  Manson Passey, MD  [facility]  04/14/2012, 1:05 PM  Pager #: [phone]

## 2012-04-14 NOTE — Progress Notes (Signed)
DC to home. To car by wc. No change from AM assessment.  Hartley Barefoot RN 04/14/2012

## 2012-04-14 NOTE — Progress Notes (Signed)
PHARMACIST - PHYSICIAN COMMUNICATION DR:   Elisabeth Pigeon CONCERNING: Antibiotic IV to Oral Route Change Policy  RECOMMENDATION: This patient is receiving Levaquin by the intravenous route.  Based on criteria approved by the Pharmacy and Therapeutics Committee, the antibiotic(s) is/are being converted to the equivalent oral dose form(s).   DESCRIPTION: These criteria include:  Patient being treated for a respiratory tract infection, urinary tract infection, or cellulitis  The patient is not neutropenic and does not exhibit a GI malabsorption state  The patient is eating (either orally or via tube) and/or has been taking other orally administered medications for a least 24 hours  The patient is improving clinically and has a Tmax < 100.5  If you have questions about this conversion, please contact the Pharmacy Department  []   [phone] )  [facility] []   [phone] )  [facility]  []   [phone] )  [facility] [x]   [phone] )  [facility]    Hessie Knows, PharmD, [place] Pager [phone] 04/14/2012 11:21 AM

## 2012-04-14 NOTE — Progress Notes (Signed)
Pt selected [facility] for HHPT.  

## 2012-04-18 LAB — CULTURE, BLOOD (ROUTINE X 2)
Culture: NO GROWTH
Culture: NO GROWTH

## 2012-04-19 ENCOUNTER — Ambulatory Visit (HOSPITAL_COMMUNITY)
Admission: RE | Admit: 2012-04-19 | Discharge: 2012-04-19 | Disposition: A | Discharge status: Home / Self Care | Payer: Medicare Other | Source: Ambulatory Visit | Attending: Cardiovascular Disease | Admitting: Cardiovascular Disease

## 2012-04-19 DIAGNOSIS — R5383 Other fatigue: Secondary | ICD-10-CM | POA: Insufficient documentation

## 2012-04-19 DIAGNOSIS — R42 Dizziness and giddiness: Secondary | ICD-10-CM | POA: Insufficient documentation

## 2012-04-19 DIAGNOSIS — I10 Essential (primary) hypertension: Secondary | ICD-10-CM | POA: Insufficient documentation

## 2012-04-19 DIAGNOSIS — R5381 Other malaise: Secondary | ICD-10-CM | POA: Insufficient documentation

## 2012-04-19 DIAGNOSIS — R0609 Other forms of dyspnea: Secondary | ICD-10-CM | POA: Insufficient documentation

## 2012-04-19 DIAGNOSIS — Z87891 Personal history of nicotine dependence: Secondary | ICD-10-CM | POA: Insufficient documentation

## 2012-04-19 DIAGNOSIS — Z9289 Personal history of other medical treatment: Secondary | ICD-10-CM

## 2012-04-19 DIAGNOSIS — R0989 Other specified symptoms and signs involving the circulatory and respiratory systems: Secondary | ICD-10-CM | POA: Insufficient documentation

## 2012-04-19 DIAGNOSIS — R0602 Shortness of breath: Secondary | ICD-10-CM | POA: Insufficient documentation

## 2012-04-19 DIAGNOSIS — I25810 Atherosclerosis of coronary artery bypass graft(s) without angina pectoris: Secondary | ICD-10-CM

## 2012-04-19 HISTORY — DX: Personal history of other medical treatment: Z92.89

## 2012-04-19 MED ORDER — TECHNETIUM TC 99M SESTAMIBI GENERIC - CARDIOLITE
10.0000 | Freq: Once | INTRAVENOUS | Status: AC | PRN
  Administered 2012-04-19: 10 via INTRAVENOUS

## 2012-04-19 MED ORDER — REGADENOSON 0.4 MG/5ML IV SOLN
0.4000 mg | Freq: Once | INTRAVENOUS | Status: AC
Start: 2012-04-19 — End: 2012-04-19
  Administered 2012-04-19: 0.4 mg via INTRAVENOUS

## 2012-04-19 MED ORDER — TECHNETIUM TC 99M SESTAMIBI GENERIC - CARDIOLITE
30.0000 | Freq: Once | INTRAVENOUS | Status: AC | PRN
  Administered 2012-04-19: 30 via INTRAVENOUS

## 2012-04-19 NOTE — Procedures (Addendum)
[facility] [address] [place] [address] [phone]  Cardiology Nuclear Med Study  James Pope is a 79 y.o. male     MRN : [id number]     DOB: 1932-08-31  Procedure Date: 04/19/2012  Nuclear Med Background Indication for Stress Test:  Evaluation for Ischemia and Post Hospital History:  Emphysema and CAD;[id number]-0316/12 Cardiac Risk Factors: History of Smoking, Hypertension, Lipids and Overweight  Symptoms:  Dizziness, DOE, Fatigue and SOB   Nuclear Pre-Procedure Caffeine/Decaff Intake:  7:00pm NPO After: 5:00am   IV Site: R Antecubital  IV 0.9% NS with Angio Cath:  22g  Chest Size (in):  48" IV Started by: Emmit Pomfret, RN  Height: 5\' 11"  (1.803 m)  Cup Size: n/a  BMI:  Body mass index is 26.93 kg/(m^2). Weight:  193 lb (87.544 kg)   Tech Comments:  N/A    Nuclear Med Study 1 or 2 day study: 1 day  Stress Test Type:  Lexiscan  Order Authorizing Provider:  [name], MD   Resting Radionuclide: Technetium 23m Sestamibi  Resting Radionuclide Dose: 11.0 mCi   Stress Radionuclide:  Technetium 71m Sestamibi  Stress Radionuclide Dose: 31.2 mCi           Stress Protocol Rest HR: 60 Stress HR: 103  Rest BP: 126/90 Stress BP: 127/91  Exercise Time (min): n/a METS: n/a   Predicted Max HR: 143 bpm % Max HR: 72.03 bpm Rate Pressure Product: 69629  Dose of Adenosine (mg):  n/a Dose of Lexiscan: 0.4 mg  Dose of Atropine (mg): n/a Dose of Dobutamine: n/a mcg/kg/min (at max HR)  Stress Test Technologist: Esperanza Sheets, CCT Nuclear Technologist: Koren Shiver, CNMT   Rest Procedure:  Myocardial perfusion imaging was performed at rest 45 minutes following the intravenous administration of Technetium 58m Sestamibi. Stress Procedure:  The patient received IV Lexiscan 0.4 mg over 15-seconds.  Technetium 74m Sestamibi injected at 30-seconds.  There were no significant changes with Lexiscan.  Quantitative spect images  were obtained after a 45 minute delay.  Transient Ischemic Dilatation (Normal <1.22):  0.83 Lung/Heart Ratio (Normal <0.45):  0.30 QGS EDV:  54 ml QGS ESV:  19 ml LV Ejection Fraction: 66%  Signed by      Rest ECG: NSR - Normal EKG  Stress ECG: No significant change from baseline ECG  QPS Raw Data Images:  Normal; no motion artifact; normal heart/lung ratio. Stress Images:  There is decreased uptake in the inferior wall. Rest Images:  There is decreased uptake in the inferior wall. Subtraction (SDS):  There is a fixed inferior defect that is most consistent with diaphragmatic attenuation.  Impression Exercise Capacity:  Lexiscan with no exercise. BP Response:  Normal blood pressure response. Clinical Symptoms:  No significant symptoms noted. ECG Impression:  No significant ST segment change suggestive of ischemia. Comparison with Prior Nuclear Study: Compared with 2011, there is a similar inferolateral perfusion abnormality, but it appears non-reversible on the current study. As wall motion is normal in this area, it is most consistent with diaphragmatic attenuation artifact.  Overall Impression:  Low risk stress nuclear study.  LV Wall Motion:  NL LV Function; NL Wall Motion   James Borge, MD  04/19/2012 1:09 PM

## 2012-04-21 ENCOUNTER — Telehealth: Payer: Self-pay | Admitting: Internal Medicine

## 2012-04-21 NOTE — Telephone Encounter (Signed)
I spoke the pt and he states that he was in the hospital and was given and antibiotic, levaquin, and he states since he started this med that his hiccups have returned and are constant. He states he thinks it is the medication. I advised the pt to call his PCP about this issue. Carron Curie, CMA

## 2012-04-27 ENCOUNTER — Encounter: Payer: Self-pay | Admitting: Internal Medicine

## 2012-04-28 ENCOUNTER — Telehealth: Payer: Self-pay | Admitting: Internal Medicine

## 2012-04-28 ENCOUNTER — Ambulatory Visit (INDEPENDENT_AMBULATORY_CARE_PROVIDER_SITE_OTHER): Payer: Medicare Other | Admitting: Adult Health

## 2012-04-28 ENCOUNTER — Encounter: Payer: Self-pay | Admitting: Adult Health

## 2012-04-28 ENCOUNTER — Ambulatory Visit (INDEPENDENT_AMBULATORY_CARE_PROVIDER_SITE_OTHER)
Admission: RE | Admit: 2012-04-28 | Discharge: 2012-04-28 | Disposition: A | Discharge status: Home / Self Care | Payer: Medicare Other | Source: Ambulatory Visit | Attending: Adult Health | Admitting: Adult Health

## 2012-04-28 VITALS — BP 110/80 | HR 75 | Temp 96.8°F | Ht 71.0 in | Wt 191.8 lb

## 2012-04-28 DIAGNOSIS — R1310 Dysphagia, unspecified: Secondary | ICD-10-CM

## 2012-04-28 DIAGNOSIS — J189 Pneumonia, unspecified organism: Secondary | ICD-10-CM

## 2012-04-28 DIAGNOSIS — J8410 Pulmonary fibrosis, unspecified: Secondary | ICD-10-CM

## 2012-04-28 NOTE — Assessment & Plan Note (Addendum)
Recent flare with CAP now resolving  Returning to his baseline , will attempt to decrease steroids once again slowly   Plan  Alternate Medrol 16mg  1 tab  And 1/2 tab for 2 weeks  then 1/2 tab daily- hold at this dose until seen back in office  Discuss with Dr. Fraser Din today referral to GI doctor for swallow issues  Follow med calendar closely and bring to each visit.  follow up Dr. Sherene Sires  In 4 weeks and As needed

## 2012-04-28 NOTE — Telephone Encounter (Signed)
lmomtcb for the pts wife to call back

## 2012-04-28 NOTE — Assessment & Plan Note (Signed)
Recent admission for CAP  Improving clinically , finished abx  cxr w/ chronic changes

## 2012-04-28 NOTE — Patient Instructions (Addendum)
Alternate Medrol 16mg  1 tab  And 1/2 tab for 2 weeks  then 1/2 tab daily- hold at this dose until seen back in office  Discuss with Dr. Fraser Din today referral to GI doctor for swallow issues  Follow med calendar closely and bring to each visit.  follow up Dr. Sherene Sires  In 4 weeks and As needed

## 2012-04-28 NOTE — Progress Notes (Signed)
Subjective:    Patient ID: James Pope, male    DOB: 08/02/1932   MRN: [id number]  HPI  15 yobm quit smoking 2000 no respiratory sequelae referred 12/23/2011  by Dr [name] for sob and cough.   12/23/2011 1st pulmonary eval cc indolent onset x one year gradually worse doe 1st with gardening (also knees gave) to point where can no longer do yard work,  Also problem with talking assoc with voice change, cough mostly dry and daytime, not related to meals- tried flovent no better. No h/o RA or unusual exposures. rec Try pantoprazole 40 mg  Take 30-60 min before first meal of the day and Pepcid 20 mg one bedtime until cough   GERD diet  D/c flovent   01/14/2012 f/u ov/Wert cc no better at all x perhaps a bit less cough. rec Stay on pantoprazole and pepcid as before plus observe the GERD diet as best you can Medrol 16 mg 2 daily until coughing and breathing are better then reduce the dose to one pill daily taken at breakfast   02/11/2012 f/u ov/Wert did not start any medrol but worse cough and sob x 3-4 days in terms of breathing and generally confused about all his meds, brings in multiple sheets of lists that don't reconcile with one another.  rec Stay on pantoprazole and pepcid as before plus observe the GERD diet as best you can Medrol(methylprednisolone)  16 mg 2 daily until coughing and breathing are better then reduce the dose to one pill daily taken at breakfast    02/25/12 ov / cough better, breathing not better rec reduce medrol to 16 mg daily Follow med calendar   03/09/2012 f/u ov/Wert no med calendar cc severe muscle cramps, cough resolved, breathing worse on the lower dose of medrol Stop atorvastatin Reduce medrol to 16 mg one half daily Take quinine for cramps   03/28/2012 f/u ov/Wert  Cramps gone never took quinine but cc doe worse on lower dose eg walking around the outside house and down to the street and back to house s stopping, cough a little more ,  dry. >>Increased medrol 32mg   daily and decreased atenolol 1/2 daily   04/28/2012 Follow up and Med review  He returns for follow up and med review .  We reviewed all his meds and organized them into a med calendar .  Recent admit for CAP, sepsis (elevated LA and PCT) , tx w/ abx and steroids .  Discharged on Levaquin and Medrol 16mg  daily  Since discharge he is feeling better  CXR today shows chronic scarring, no acute process.  He denies any hemoptysis, chest pain , edema.  Still works on in yard with light activities , ADLS, light chores , ie trash, etc.  Does have intermittent swallow issues w/ food sticking. Has ov with Dr. Fraser Din -PCP this evening to discuss referral to her GI doctor. No n/v, bloody stools or wt loss. No overt reflux on PPI and pepcid   ROS   Constitutional:   No  weight loss, night sweats,  Fevers, chills,  +fatigue, or  lassitude.  HEENT:   No headaches,  Difficulty swallowing,  Tooth/dental problems, or  Sore throat,                No sneezing, itching, ear ache, nasal congestion, post nasal drip,   CV:  No chest pain,  Orthopnea, PND, swelling in lower extremities, anasarca, dizziness, palpitations, syncope.   GI  No heartburn, indigestion,  abdominal pain, nausea, vomiting, diarrhea, change in bowel habits, loss of appetite, bloody stools.   Resp:    No chest wall deformity  Skin: no rash or lesions.  GU: no dysuria, change in color of urine, no urgency or frequency.  No flank pain, no hematuria   MS:  No joint pain or swelling.  No decreased range of motion.  No back pain.  Psych:  No change in mood or affect. No depression or anxiety.  No memory loss.                  Objective:   Physical Exam  Wt 03/09/2012  192 >  195 03/28/2012 > 191 04/28/2012  Mod  hoarse amb wm nad Full set of dentures Gen: No acute distress. Well nourished and well groomed.  Neurological: Alert and oriented to person, place, and time. Coordination normal.  Head:  Normocephalic and atraumatic.  Eyes: Conjunctivae are normal. . No scleral icterus.  Neck: Normal range of motion. Neck supple. No tracheal deviation or thyromegaly present. No cervical lymphadenopathy.  Cardiovascular: Normal rate, regular rhythm, normal heart sounds and intact distal pulses. Exam reveals no gallop and no friction rub. No murmur heard.  Respiratory: Effort normal. No respiratory distress. No chest wall tenderness. Breath sounds normal. Very minimal insp crackles bilaterally s cough GI: Soft. Bowel sounds are normal. The abdomen is soft and nontender. There is no rebound and no guarding.  Musculoskeletal: Normal range of motion. Extremities are nontender.  Skin: Skin is warm and dry. No rash noted. No diaphoresis. No erythema. No pallor. No clubbing, cyanosis, or edema.  Psychiatric: Normal mood and affect. Behavior is normal. Judgment and thought content normal.      04/28/2012 CXR Stable basilar fibrosis and mild cardiomegaly. No active lung  disease.    03/28/2012 ESR on 8 mg per day = 49       Assessment & Plan:

## 2012-04-29 ENCOUNTER — Encounter: Payer: Self-pay | Admitting: Internal Medicine

## 2012-04-29 ENCOUNTER — Other Ambulatory Visit: Payer: Self-pay | Admitting: Internal Medicine

## 2012-04-29 NOTE — Telephone Encounter (Signed)
Pt was seen by TP on 04/28/12:  Patient Instructions    Alternate Medrol 16mg  1 tab And 1/2 tab for 2 weeks then 1/2 tab daily- hold at this dose until seen back in office  Discuss with Dr. Fraser Din today referral to GI doctor for swallow issues  Follow med calendar closely and bring to each visit.  follow up Dr. Sherene Sires In 4 weeks and As needed    -----  I spoke with pt's spouse.  Reports she called Dr. Lindell Spar office for GI referral but was advised he would like our office to make the referral because we are under the same system as [facility].  Tammy, pls advise if you are ok with proceeding with this or not.  Thank you.

## 2012-04-29 NOTE — Telephone Encounter (Signed)
Order placed to [facility].  Wife aware.

## 2012-04-29 NOTE — Telephone Encounter (Signed)
That is fine, he had told me he saw someone with [facility] though  Can refer to [facility] for food sticking, swallow issues  I do not see that he has ever seen [facility] but that is fine.  Thanks

## 2012-04-29 NOTE — Telephone Encounter (Signed)
Pt's wife returned call.  They can be reached @ [phone]. James Pope

## 2012-05-20 ENCOUNTER — Encounter: Payer: Self-pay | Admitting: Adult Health

## 2012-05-26 ENCOUNTER — Ambulatory Visit (INDEPENDENT_AMBULATORY_CARE_PROVIDER_SITE_OTHER)
Admission: RE | Admit: 2012-05-26 | Discharge: 2012-05-26 | Disposition: A | Discharge status: Home / Self Care | Payer: Medicare Other | Source: Ambulatory Visit | Attending: Internal Medicine | Admitting: Internal Medicine

## 2012-05-26 ENCOUNTER — Ambulatory Visit (INDEPENDENT_AMBULATORY_CARE_PROVIDER_SITE_OTHER): Payer: Medicare Other | Admitting: Internal Medicine

## 2012-05-26 ENCOUNTER — Encounter: Payer: Self-pay | Admitting: Internal Medicine

## 2012-05-26 VITALS — BP 128/68 | HR 102 | Temp 98.8°F | Ht 71.0 in | Wt 192.2 lb

## 2012-05-26 DIAGNOSIS — M949 Disorder of cartilage, unspecified: Secondary | ICD-10-CM

## 2012-05-26 DIAGNOSIS — M8580 Other specified disorders of bone density and structure, unspecified site: Secondary | ICD-10-CM

## 2012-05-26 DIAGNOSIS — J8410 Pulmonary fibrosis, unspecified: Secondary | ICD-10-CM

## 2012-05-26 DIAGNOSIS — T380X5A Adverse effect of glucocorticoids and synthetic analogues, initial encounter: Secondary | ICD-10-CM

## 2012-05-26 DIAGNOSIS — M899 Disorder of bone, unspecified: Secondary | ICD-10-CM

## 2012-05-26 NOTE — Patient Instructions (Addendum)
Please see patient coordinator before you leave today  to schedule bone density  Medrol 16 mg 2 daily until breathing better then one daily   I will check to see if can get you the new fibrosis medication  Please schedule a follow up office visit in 6 weeks, call sooner if needed

## 2012-05-26 NOTE — Progress Notes (Signed)
Subjective:    Patient ID: James Pope, male    DOB: July 23, 1932   MRN: [id number]  HPI  63 yobm quit smoking 2000 no respiratory sequelae referred 12/23/2011  by Dr [name] for sob and cough.   12/23/2011 1st pulmonary eval cc indolent onset x one year gradually worse doe 1st with gardening (also knees gave) to point where can no longer do yard work,  Also problem with talking assoc with voice change, cough mostly dry and daytime, not related to meals- tried flovent no better. No h/o RA or unusual exposures. rec Try pantoprazole 40 mg  Take 30-60 min before first meal of the day and Pepcid 20 mg one bedtime until cough   GERD diet  D/c flovent   01/14/2012 f/u ov/James Pope cc no better at all x perhaps a bit less cough. rec Stay on pantoprazole and pepcid as before plus observe the GERD diet as best you can Medrol 16 mg 2 daily until coughing and breathing are better then reduce the dose to one pill daily taken at breakfast   02/11/2012 f/u ov/James Pope did not start any medrol but worse cough and sob x 3-4 days in terms of breathing and generally confused about all his meds, brings in multiple sheets of lists that don't reconcile with one another.  rec Stay on pantoprazole and pepcid as before plus observe the GERD diet as best you can Medrol(methylprednisolone)  16 mg 2 daily until coughing and breathing are better then reduce the dose to one pill daily taken at breakfast    02/25/12 ov / cough better, breathing not better rec reduce medrol to 16 mg daily Follow med calendar   03/09/2012 f/u ov/James Pope no med calendar cc severe muscle cramps, cough resolved, breathing worse on the lower dose of medrol Stop atorvastatin Reduce medrol to 16 mg one half daily Take quinine for cramps   03/28/2012 f/u ov/James Pope  Cramps gone never took quinine but cc doe worse on lower dose eg walking around the outside house and down to the street and back to house s stopping, cough a little more ,  dry. >>Increased medrol 32mg   daily and decreased atenolol 1/2 daily   04/28/2012 Follow up and Med review  He returns for follow up and med review .  We reviewed all his meds and organized them into a med calendar .  Recent admit for CAP, sepsis (elevated LA and PCT) , tx w/ abx and steroids .  Discharged on Levaquin and Medrol 16mg  daily  Since discharge he is feeling better  CXR today shows chronic scarring, no acute process.  He denies any hemoptysis, chest pain , edema.  Still works on in yard with light activities , ADLS, light chores , ie trash, etc.  Does have intermittent swallow issues w/ food sticking. Has ov with Dr. Fraser Din -PCP this evening to discuss referral to her GI doctor. No n/v, bloody stools or wt loss. No overt reflux on PPI and pepcid  rec Alternate Medrol 16mg  1 tab  And 1/2 tab for 2 weeks  then 1/2 tab daily- hold at this dose until seen back in office  Discuss with Dr. Fraser Din today referral to GI doctor for swallow issues   05/26/2012 f/u ov/James Pope re pf/ cramps on medrol 16 mg one half daily  Chief Complaint  Patient presents with  . Follow-up    Pt states DOE progressively worse since his last visit. Has occ non prod cough.    Did not  try quinine for cramps which are some better.  No obvious daytime variabilty or assoc   cp or chest tightness, subjective wheeze overt sinus or hb symptoms. No unusual exp hx or h/o childhood pna/ asthma or premature birth to his knowledge.   Sleeping ok without nocturnal  or early am exacerbation  of respiratory  c/o's or need for noct saba. Also denies any obvious fluctuation of symptoms with weather or environmental changes or other aggravating or alleviating factors except as outlined above   Current Medications, Allergies, Past Medical History, Past Surgical History, Family History, and Social History were reviewed in [employer] record.  ROS  The following are not active complaints unless  bolded sore throat, dysphagia, dental problems, itching, sneezing,  nasal congestion or excess/ purulent secretions, ear ache,   fever, chills, sweats, unintended wt loss, pleuritic or exertional cp, hemoptysis,  orthopnea pnd or leg swelling, presyncope, palpitations, heartburn, abdominal pain, anorexia, nausea, vomiting, diarrhea  or change in bowel or urinary habits, change in stools or urine, dysuria,hematuria,  rash, arthralgias, visual complaints, headache, numbness weakness or ataxia or problems with walking or coordination,  change in mood/affect or memory.                      Objective:   Physical Exam  Wt 03/09/2012  192 >  195 03/28/2012 > 191 04/28/2012 > 05/26/2012  192 Mod  hoarse amb wm nad with nl vital signs Full set of dentures Gen: No acute distress. Well nourished and well groomed.  Neurological: Alert and oriented to person, place, and time. Coordination normal.  Head: Normocephalic and atraumatic.  Eyes: Conjunctivae are normal. . No scleral icterus.  Neck: Normal range of motion. Neck supple. No tracheal deviation or thyromegaly present. No cervical lymphadenopathy.  Cardiovascular: Normal rate, regular rhythm, normal heart sounds and intact distal pulses. Exam reveals no gallop and no friction rub. No murmur heard.  Respiratory: Effort normal. No respiratory distress. No chest wall tenderness. Breath sounds normal. Very minimal insp crackles bilaterally s cough GI: Soft. Bowel sounds are normal. The abdomen is soft and nontender. There is no rebound and no guarding.  Musculoskeletal: Normal range of motion. Extremities are nontender.  Skin: Skin is warm and dry. No rash noted. No diaphoresis. No erythema. No pallor. No clubbing, cyanosis, or edema.  Psychiatric: Normal mood and affect. Behavior is normal. Judgment and thought content normal.      04/28/2012 CXR Stable basilar fibrosis and mild cardiomegaly. No active lung  disease.    03/28/2012 ESR on 8 mg  per day = 49       Assessment & Plan:

## 2012-05-29 NOTE — Assessment & Plan Note (Addendum)
-   First suggested on cxr 12/2004 but clinically limiting activity since 2012 - PFT's 11/24/11  VC 2.94 and dllc 29%, FEV1 80%, ratio 74, no change w/ BD  - 12/23/2011  Walked RA x 2 laps @ 185 ft each stopped due to  desat to 86% -01/14/2012  Walked RA  2 laps @ 185 ft each stopped due to  desat to 87%  - ESR 72 12/23/11  > started steroids 02/11/12 -ESR 02/25/2012 >15  -ONO 02/25/2012 :  02 sats < 89% x 48 sec -Med calendar >02/25/2012  > esr 15 so rec  reduce medrol  to 16 mg daily - 03/09/2012  Walked RA x 3 laps @ 185 ft each stopped due to  desat p 3    - 03/28/2012  Walked RA x 3 laps @ 185 ft each stopped due to  desat p 3 -Reduce medrol to 8 mg daily effective 03/10/12> worse 03/28/2012 so changed to 32 mg daily with esr = 49 -Changed 05/26/12 to self titration Medrol 32 mg per day until better then 16 mg per day as floor  The goal with a chronic steroid dependent illness is always arriving at the lowest effective dose that controls the disease/symptoms and not accepting a set "formula" which is based on statistics or guidelines that don't always take into account patient  variability or the natural hx of the dz in every individual patient, which may well vary over time.  For now therefore I recommend the patient maintain  A ceiling of 32 mg per day and a floor of 16 mg per day

## 2012-05-30 ENCOUNTER — Encounter: Payer: Self-pay | Admitting: Internal Medicine

## 2012-05-30 NOTE — Progress Notes (Signed)
Quick Note:  Spoke with pt and notified of results per Dr. Wert. Pt verbalized understanding and denied any questions.  ______ 

## 2012-06-01 ENCOUNTER — Ambulatory Visit (INDEPENDENT_AMBULATORY_CARE_PROVIDER_SITE_OTHER): Payer: Medicare Other | Admitting: Internal Medicine

## 2012-06-01 ENCOUNTER — Encounter: Payer: Self-pay | Admitting: Internal Medicine

## 2012-06-01 VITALS — BP 110/72 | HR 68 | Ht 71.0 in | Wt 192.6 lb

## 2012-06-01 DIAGNOSIS — R1319 Other dysphagia: Secondary | ICD-10-CM

## 2012-06-01 NOTE — Progress Notes (Signed)
Subjective:    Patient ID: James Pope, male    DOB: 05-27-1932, 79 y.o.   MRN: [id number]  HPI This very nice elderly African American man is here with a several month history of dysphagia to liquids and solids. He describes a suprasternal sticking point. I cannot tell if the liquid dysphagia only comes after swallowing solids. It is occurring in the setting of steroid treatment for pulmonary fibrosis with a recent flare. He does have some desaturation with exercise but has been okay at rest and is not on oxygen therapy. He has never had a history of problems like this in the past. That is to say no prior dysphagia. He is not describing odynophagia. He has been gaining weight since being treated with steroids. There is no real history of weight loss. He does have a history of heartburn and reflux but denies symptoms on current regimen of PPI and H2 blocker. He has never had an upper endoscopy though Dr. Randa Evens has done colonoscopy procedures on him in the past and the patient indicates he would be due for a routine repeat colonoscopy in 2016. This is for a history of colon polyps. A younger brother was diagnosed with esophageal cancer. Allergies  Allergen Reactions  . Other Other (See Comments)    Medication-NapraPAC (prevacid and naprosyn) Reaction-Hiccups  . Prednisone Other (See Comments)    Hiccups   Outpatient Prescriptions Prior to Visit  Medication Sig Dispense Refill  . aspirin EC 81 MG tablet Take 81 mg by mouth every morning.       Marland Kitchen atenolol (TENORMIN) 25 MG tablet Take 12.5 mg by mouth every morning.      . calcium carbonate (TUMS - DOSED IN MG ELEMENTAL CALCIUM) 500 MG chewable tablet Per bottle as needed for heartburn      . Calcium Carbonate-Vitamin D (CALCIUM 600 + D PO) Take 1 tablet by mouth at bedtime.      . colchicine 0.6 MG tablet Take 0.6 mg by mouth at bedtime.       . famotidine (PEPCID) 20 MG tablet Take 20 mg by mouth at bedtime. One at bedtime      .  Homeopathic Products ([employer] LEG CRAMPS PAIN RELIEF PO) Take 1 tablet by mouth daily as needed.      . methylPREDNISolone (MEDROL) 16 MG tablet 1/2 tablet daily      . Multiple Vitamins-Minerals (MULTIVITAMINS THER. W/MINERALS) TABS Take 1 tablet by mouth every morning.       . pantoprazole (PROTONIX) 40 MG tablet TAKE ONE TABLET BY MOUTH ONE TIME DAILY 30 TO 60 MINUTES BEFORE FIRST MEAL OF THE DAY  30 tablet  5  . simethicone (MYLICON) 125 MG chewable tablet Chew by mouth. Per bottle as needed for gas/bloating      . Tamsulosin HCl (FLOMAX) 0.4 MG CAPS Take 0.4 mg by mouth at bedtime.           Past Medical History  Diagnosis Date  . Hypertension   . Gout   . Hyperlipidemia   . Prostate cancer   . CAD (coronary artery disease)   . COPD (chronic obstructive pulmonary disease)   . Anxiety and depression   . Hiatal hernia   . Pulmonary fibrosis 12/23/2011    Followed in Pulmonary clinic/ [facility]/ Wert  - First suggested on cxr 12/2004 but clinically limiting activity since 2012 - PFT's 11/24/11  VC 2.94 and dllc 29%, FEV1 80%, ratio 74, no change w/ BD  -  12/23/2011  Walked RA x 2 laps @ 185 ft each stopped due to  desat to 86% -01/14/2012  Walked RA  2 laps @ 185 ft each stopped due to  desat to 87%  - ESR 72 12/23/11  > started steroids 1/23  . CKD (chronic kidney disease) 08/07/2011  . History of colon polyps    Past Surgical History  Procedure Laterality Date  . Cardiac catheterization    . Skin graft      rt arm  . Colonoscopy      x 2- Dr. Randa Evens   History   Social History  . Marital Status: Married    Spouse Name: N/A    Number of Children: 2  . Years of Education: N/A   Occupational History  . Retired     [employer] [employer]   Social History Main Topics  . Smoking status: Former Smoker -- 1.00 packs/day for 40 years    Types: Cigarettes    Quit date: 01/19/1998  . Smokeless tobacco: Never Used  . Alcohol Use: No  . Drug Use: No  . Sexually Active: None   Other  Topics Concern  . None   Social History Narrative   Married and retired, 2 sons   Family History  Problem Relation Age of Onset  . Diabetes type II Father   . Prostate cancer Father   . Prostate cancer Brother   . Throat cancer Brother     smoked  . Heart disease Father     Review of Systems This is positive for things mentioned above and also including arthritis, some anxiety, last year he had some syncopal-type spells, none this year. He said some hoarseness. He will edema. He was hospitalized in March with her respiratory illness. He does have dyspnea on exertion. All other review of systems are negative.    Objective:   Physical Exam General:  Elderly, in no acute distress able to lie flat w/o problem Eyes:  anicteric. ENT:   Mouth and posterior pharynx free of lesions. + dentures, no thrush Neck:   supple w/o thyromegaly or mass.  Lungs: Clear to auscultation bilaterally except for left base dry rales Heart:  S1S2, no rubs, murmurs, gallops. Abdomen:  soft, non-tender, no hepatosplenomegaly, , or mass and BS+. + diastasi recti Lymph:  no cervical or supraclavicular adenopathy. Extremities:   no edema Psych:  appropriate mood and  Affect.   Data Reviewed: Dr. Thurston Hole recent note, hospitalization from March, oxygen saturation testing April 10 chest x-ray report and images Blood results from March and April 2014 as filed in the EMR     Assessment & Plan:   1. Dysphagia    1. Etiology not entirely clear. I think Candida of the esophagus is possible, dysmotility, stricture, less likely malignancy. Upper endoscopy is reasonable and has been scheduled. We did discuss using barium swallow first, I think more information and potential treatment with dilation is offered by upper endoscopy so we chose that when. 2. The risks and benefits as well as alternatives of endoscopic procedure(s) have been discussed and reviewed. All questions answered. The patient agrees to  proceed. 3. Further plans pending results and clinical course.  I appreciate the opportunity to care for this patient.  CC: Laurena Slimmer, MD and Sandrea Hughs, MD

## 2012-06-01 NOTE — Patient Instructions (Addendum)
You have been scheduled for an endoscopy with propofol. Please follow written instructions given to you at your visit today. If you use inhalers (even only as needed), please bring them with you on the day of your procedure. Your physician has requested that you go to www.startemmi.com and enter the access code given to you at your visit today. This web site gives a general overview about your procedure. However, you should still follow specific instructions given to you by our office regarding your preparation for the procedure.   Thank you for choosing me and [facility].  Carl E. Gessner, M.D., FACG  

## 2012-06-02 ENCOUNTER — Encounter: Payer: Self-pay | Admitting: Internal Medicine

## 2012-06-02 ENCOUNTER — Ambulatory Visit (AMBULATORY_SURGERY_CENTER): Payer: Medicare Other | Admitting: Internal Medicine

## 2012-06-02 VITALS — BP 148/94 | HR 67 | Temp 97.2°F | Resp 29 | Ht 71.0 in | Wt 192.0 lb

## 2012-06-02 DIAGNOSIS — R1319 Other dysphagia: Secondary | ICD-10-CM

## 2012-06-02 DIAGNOSIS — B3781 Candidal esophagitis: Secondary | ICD-10-CM

## 2012-06-02 MED ORDER — SODIUM CHLORIDE 0.9 % IV SOLN: 500.0000 mL | INTRAVENOUS | Status: DC

## 2012-06-02 NOTE — Progress Notes (Signed)
Patient did not experience any of the following events: a burn prior to discharge; a fall within the facility; wrong site/side/patient/procedure/implant event; or a hospital transfer or hospital admission upon discharge from the facility. (G8907) Patient did not have preoperative order for IV antibiotic SSI prophylaxis. (G8918)  

## 2012-06-02 NOTE — Progress Notes (Signed)
Lidocaine-40mg IV prior to Propofol InductionPropofol given over incremental dosages 

## 2012-06-02 NOTE — Patient Instructions (Addendum)
There were some changes in the esophagus that suggest possible infection - Candida esophagitis might be present. I took biopsies to see. I also went ahead and dilated the esophagus to see if that helps your swallowing. The remainder of the exam of the esophagus, stomach and duodenum was fine - ok. My office will call with results.  I appreciate the opportunity to care for you.  Iva Boop, MD, FACG  YOU HAD AN ENDOSCOPIC PROCEDURE TODAY AT THE [facility]: Refer to the procedure report that was given to you for any specific questions about what was found during the examination.  If the procedure report does not answer your questions, please call your gastroenterologist to clarify.  If you requested that your care partner not be given the details of your procedure findings, then the procedure report has been included in a sealed envelope for you to review at your convenience later.  YOU SHOULD EXPECT: Some feelings of bloating in the abdomen. Passage of more gas than usual.  Walking can help get rid of the air that was put into your GI tract during the procedure and reduce the bloating. If you had a lower endoscopy (such as a colonoscopy or flexible sigmoidoscopy) you may notice spotting of blood in your stool or on the toilet paper. If you underwent a bowel prep for your procedure, then you may not have a normal bowel movement for a few days.  DIET: Your first meal following the procedure should be a light meal and then it is ok to progress to your normal diet.  A half-sandwich or bowl of soup is an example of a good first meal.  Heavy or fried foods are harder to digest and may make you feel nauseous or bloated.  Likewise meals heavy in dairy and vegetables can cause extra gas to form and this can also increase the bloating.  Drink plenty of fluids but you should avoid alcoholic beverages for 24 hours.  ACTIVITY: Your care partner should take you home directly after the procedure.   You should plan to take it easy, moving slowly for the rest of the day.  You can resume normal activity the day after the procedure however you should NOT DRIVE or use heavy machinery for 24 hours (because of the sedation medicines used during the test).    SYMPTOMS TO REPORT IMMEDIATELY: A gastroenterologist can be reached at any hour.  During normal business hours, 8:30 AM to 5:00 PM Monday through Friday, call [phone].  After hours and on weekends, please call the GI answering service at [phone] who will take a message and have the physician on call contact you.   Following upper endoscopy (EGD)  Vomiting of blood or coffee ground material  New chest pain or pain under the shoulder blades  Painful or persistently difficult swallowing  New shortness of breath  Fever of 100F or higher  Black, tarry-looking stools  FOLLOW UP: If any biopsies were taken you will be contacted by phone or by letter within the next 1-3 weeks.  Call your gastroenterologist if you have not heard about the biopsies in 3 weeks.  Our staff will call the home number listed on your records the next business day following your procedure to check on you and address any questions or concerns that you may have at that time regarding the information given to you following your procedure. This is a courtesy call and so if there is no answer at the  home number and we have not heard from you through the emergency physician on call, we will assume that you have returned to your regular daily activities without incident.  SIGNATURES/CONFIDENTIALITY: You and/or your care partner have signed paperwork which will be entered into your electronic medical record.  These signatures attest to the fact that that the information above on your After Visit Summary has been reviewed and is understood.  Full responsibility of the confidentiality of this discharge information lies with you and/or your care-partner.  *See dilation  diet instructions for today

## 2012-06-02 NOTE — Op Note (Signed)
[facility] [address] [place], [address]   ENDOSCOPY PROCEDURE REPORT  PATIENT: James Pope, James Pope  MR#: [id number] BIRTHDATE: 04/21/1932 , 79  yrs. old GENDER: Male ENDOSCOPIST: Iva Boop, MD, Clementeen Graham REFERRED BY:  Nyoka Cowden, M.D. PROCEDURE DATE:  06/02/2012 PROCEDURE:  EGD w/ biopsy and Maloney dilation of esophagus ASA CLASS:     Class III INDICATIONS:  Dysphagia. MEDICATIONS: propofol (Diprivan) 50mg  IV, MAC sedation, administered by CRNA, and These medications were titrated to patient response per physician's verbal order TOPICAL ANESTHETIC: none  DESCRIPTION OF PROCEDURE: After the risks benefits and alternatives of the procedure were thoroughly explained, informed consent was obtained.  The LB-GIF Q180 [id number] endoscope was introduced through the mouth and advanced to the second portion of the duodenum. Without limitations.  The instrument was slowly withdrawn as the mucosa was fully examined.        ESOPHAGUS: White exudates consistent with possible candidiasis were found in the upper third of the esophagus and middle third of the esophagus.   Biopsies were taken. The changes/ # of exudate were mild.  The remainder of the upper endoscopy exam was otherwise normal. Retroflexed views revealed no abnormalities.     The scope was then withdrawn from the patient, and a 40 [place] Maloney dilator was easily passed. Some heme was seen, small (note that biopsies had been taken). Then the procedure was complete.  COMPLICATIONS: There were no complications. ENDOSCOPIC IMPRESSION: 1.   White exudates in esophagus ? Candidiasis - biopsies taken 2.   The remainder of the upper endoscopy exam was otherwise normal - 54 Fr Maloney dilation performed for dysphagia  RECOMMENDATIONS: 1.  Clear liquids until 30865, then soft foods rest of day.  Resume prior diet tomorrow. 2.  Office will call with results and plans   eSigned:  Iva Boop, MD,  [facility] 06/02/2012 11:29 AM  HQ:[email or web address] B Sherene Sires, MD, [name], MD, and The Patient

## 2012-06-02 NOTE — Progress Notes (Signed)
Called to room to assist during endoscopic procedure.  Patient ID and intended procedure confirmed with present staff. Received instructions for my participation in the procedure from the performing physician.  

## 2012-06-03 ENCOUNTER — Telehealth: Payer: Self-pay

## 2012-06-03 NOTE — Telephone Encounter (Signed)
  Follow up Call-  Call back number 06/02/2012  Post procedure Call Back phone  # [phone]   Permission to leave phone message Yes     Patient questions:  Do you have a fever, pain , or abdominal swelling? no Pain Score  0 *  Have you tolerated food without any problems? yes  Have you been able to return to your normal activities? yes  Do you have any questions about your discharge instructions: Diet   no Medications  no Follow up visit  no  Do you have questions or concerns about your Care? no  Actions: * If pain score is 4 or above: No action needed, pain <4.

## 2012-06-09 ENCOUNTER — Encounter: Payer: Self-pay | Admitting: Internal Medicine

## 2012-06-09 DIAGNOSIS — B3781 Candidal esophagitis: Secondary | ICD-10-CM | POA: Insufficient documentation

## 2012-06-10 ENCOUNTER — Other Ambulatory Visit: Payer: Self-pay

## 2012-06-10 MED ORDER — FLUCONAZOLE 100 MG PO TABS: ORAL_TABLET | ORAL | Status: DC

## 2012-06-10 NOTE — Progress Notes (Signed)
Patient notified to hold colchicine while taking Diflucan.  He will call back if he has problems with his gout

## 2012-06-28 ENCOUNTER — Other Ambulatory Visit: Payer: Self-pay | Admitting: Internal Medicine

## 2012-07-07 ENCOUNTER — Encounter: Payer: Self-pay | Admitting: Internal Medicine

## 2012-07-07 ENCOUNTER — Ambulatory Visit (INDEPENDENT_AMBULATORY_CARE_PROVIDER_SITE_OTHER): Payer: Medicare Other | Admitting: Internal Medicine

## 2012-07-07 ENCOUNTER — Telehealth: Payer: Self-pay | Admitting: Internal Medicine

## 2012-07-07 ENCOUNTER — Other Ambulatory Visit (INDEPENDENT_AMBULATORY_CARE_PROVIDER_SITE_OTHER): Payer: Medicare Other

## 2012-07-07 VITALS — BP 144/84 | HR 84 | Temp 96.4°F | Ht 71.0 in | Wt 196.6 lb

## 2012-07-07 DIAGNOSIS — J8410 Pulmonary fibrosis, unspecified: Secondary | ICD-10-CM

## 2012-07-07 LAB — BASIC METABOLIC PANEL
Chloride: 103 mEq/L (ref 96–112)
Potassium: 4.1 mEq/L (ref 3.5–5.1)

## 2012-07-07 NOTE — Telephone Encounter (Signed)
Notes Recorded by Nyoka Cowden, MD on 07/07/2012 at 11:59 AM Call patient : Studies are unremarkable, no change in recs   Spouse aware of results

## 2012-07-07 NOTE — Patient Instructions (Addendum)
Please see patient coordinator before you leave today  to schedule CT of chest  Please remember to go to the lab  department downstairs for your tests - we will call you with the results when they are available.  Leave medrol at 16 mg daily as per calendar  I will submit your records for review for the new medication for fibrosis  Please schedule a follow up office visit in 6 weeks, call sooner if needed

## 2012-07-07 NOTE — Progress Notes (Signed)
Subjective:    Patient ID: James Pope, male    DOB: 04-Oct-1932   MRN: [id number]    Brief patient profile:  79 yobm quit smoking 2000 no respiratory sequelae referred 12/23/2011  by Dr [name] for sob and cough.   12/23/2011 1st pulmonary eval cc indolent onset x one year gradually worse doe 1st with gardening (also knees gave) to point where can no longer do yard work,  Also problem with talking assoc with voice change, cough mostly dry and daytime, not related to meals- tried flovent no better. No h/o RA or unusual exposures. rec Try pantoprazole 40 mg  Take 30-60 min before first meal of the day and Pepcid 20 mg one bedtime    GERD diet  D/c flovent   01/14/2012 f/u ov/James Pope cc no better at all x perhaps a bit less cough. rec Stay on pantoprazole and pepcid as before plus observe the GERD diet as best you can Medrol 16 mg 2 daily until coughing and breathing are better then reduce the dose to one pill daily taken at breakfast   02/11/2012 f/u ov/James Pope did not start any medrol but worse cough and sob x 3-4 days in terms of breathing and generally confused about all his meds, brings in multiple sheets of lists that don't reconcile with one another.  rec Stay on pantoprazole and pepcid as before plus observe the GERD diet as best you can Medrol(methylprednisolone)  16 mg 2 daily until coughing and breathing are better then reduce the dose to one pill daily taken at breakfast    02/25/12 ov / cough better, breathing not better rec reduce medrol to 16 mg daily Follow med calendar   03/09/2012 f/u ov/James Pope no med calendar cc severe muscle cramps, cough resolved, breathing worse on the lower dose of medrol Stop atorvastatin Reduce medrol to 16 mg one half daily Take quinine for cramps   03/28/2012 f/u ov/James Pope  Cramps gone never took quinine but cc doe worse on lower dose eg walking around the outside house and down to the street and back to house s stopping, cough a little  more , dry. >>Increased medrol 32mg   daily and decreased atenolol 1/2 daily   04/28/2012 Follow up and Med review  He returns for follow up and med review .  We reviewed all his meds and organized them into a med calendar .  Recent admit for CAP, sepsis (elevated LA and PCT) , tx w/ abx and steroids .  Discharged on Levaquin and Medrol 16mg  daily  Since discharge he is feeling better  CXR today shows chronic scarring, no acute process.  He denies any hemoptysis, chest pain , edema.  Still works on in yard with light activities , ADLS, light chores , ie trash, etc.  Does have intermittent swallow issues w/ food sticking. Has ov with Dr. Fraser Din -PCP this evening to discuss referral to her GI doctor. No n/v, bloody stools or wt loss. No overt reflux on PPI and pepcid  rec Alternate Medrol 16mg  1 tab  And 1/2 tab for 2 weeks  then 1/2 tab daily- hold at this dose until seen back in office  Discuss with Dr. Fraser Din today referral to GI doctor for swallow issues   05/26/2012 f/u ov/James Pope re pf/ cramps on medrol 16 mg one half daily  Chief Complaint  Patient presents with  . Follow-up    Pt states DOE progressively worse since his last visit. Has occ non prod cough.  rec Please see patient coordinator before you leave today  to schedule bone density Medrol 16 mg 2 daily until breathing better then one daily    07/07/2012 f/u ov/James Pope re PF/ steroid resp component Chief Complaint  Patient presents with  . Follow-up    Pt states DOE continues to worsen since his last appt.    did not benefit from 32 mg medrol and caused hiccups so reduced to 16 mg and these resolved. Says breathing is worse but still able to walk behind a self propelled mower slowest speed (always did slowest) and can do half the yard s stopping (30 min) , breathing worse bending over, does all right at [employer] walking slow.   No obvious daytime variabilty or assoc   cp or chest tightness, subjective wheeze overt sinus or  hb symptoms. No unusual exp hx or h/o childhood pna/ asthma or premature birth to his knowledge.   Sleeping ok without nocturnal  or early am exacerbation  of respiratory  c/o's or need for noct saba. Also denies any obvious fluctuation of symptoms with weather or environmental changes or other aggravating or alleviating factors except as outlined above   Current Medications, Allergies, Past Medical History, Past Surgical History, Family History, and Social History were reviewed in [employer] record.  ROS  The following are not active complaints unless bolded sore throat, dysphagia, dental problems, itching, sneezing,  nasal congestion or excess/ purulent secretions, ear ache,   fever, chills, sweats, unintended wt loss, pleuritic or exertional cp, hemoptysis,  orthopnea pnd or leg swelling, presyncope, palpitations, heartburn, abdominal pain, anorexia, nausea, vomiting, diarrhea  or change in bowel or urinary habits, change in stools or urine, dysuria,hematuria,  rash, arthralgias, visual complaints, headache, numbness weakness or ataxia or problems with walking or coordination,  change in mood/affect or memory.                      Objective:   Physical Exam  Wt 03/09/2012  192 >  195 03/28/2012 > 191 04/28/2012 > 05/26/2012  192> 196 07/07/12 Mod  hoarse amb wm nad with nl vital signs mod cushingnoid Full set of dentures Gen: No acute distress. Well nourished and well groomed.  Neurological: Alert and oriented to person, place, and time. Coordination normal.  Head: Normocephalic and atraumatic.  Eyes: Conjunctivae are normal. . No scleral icterus.  Neck: Normal range of motion. Neck supple. No tracheal deviation or thyromegaly present. No cervical lymphadenopathy.  Cardiovascular: Normal rate, regular rhythm, normal heart sounds and intact distal pulses. Exam reveals no gallop and no friction rub. No murmur heard.  Respiratory: Effort normal. No respiratory  distress. No chest wall tenderness. Breath sounds normal. Very minimal insp crackles bilaterally s cough GI: Soft. Bowel sounds are normal. The abdomen is soft and nontender. There is no rebound and no guarding.  Musculoskeletal: Normal range of motion. Extremities are nontender.  Skin: Skin is warm and dry. No rash noted. No diaphoresis. No erythema. No pallor. No clubbing, cyanosis, or edema.  Psychiatric: Normal mood and affect. Behavior is normal. Judgment and thought content normal.      04/28/2012 CXR Stable basilar fibrosis and mild cardiomegaly. No active lung  disease.    03/28/2012 ESR on 8 mg per day = 49       Assessment & Plan:

## 2012-07-07 NOTE — Progress Notes (Signed)
Quick Note:  Spoke with pt and notified of results per Dr. Wert. Pt verbalized understanding and denied any questions.  ______ 

## 2012-07-08 ENCOUNTER — Ambulatory Visit (HOSPITAL_COMMUNITY)
Admission: RE | Admit: 2012-07-08 | Discharge: 2012-07-08 | Disposition: A | Discharge status: Home / Self Care | Payer: Medicare Other | Source: Ambulatory Visit | Attending: Internal Medicine | Admitting: Internal Medicine

## 2012-07-08 ENCOUNTER — Encounter (HOSPITAL_COMMUNITY): Payer: Self-pay

## 2012-07-08 DIAGNOSIS — I2510 Atherosclerotic heart disease of native coronary artery without angina pectoris: Secondary | ICD-10-CM | POA: Insufficient documentation

## 2012-07-08 DIAGNOSIS — J438 Other emphysema: Secondary | ICD-10-CM | POA: Insufficient documentation

## 2012-07-08 DIAGNOSIS — I700 Atherosclerosis of aorta: Secondary | ICD-10-CM | POA: Insufficient documentation

## 2012-07-08 DIAGNOSIS — J8410 Pulmonary fibrosis, unspecified: Secondary | ICD-10-CM | POA: Insufficient documentation

## 2012-07-08 DIAGNOSIS — J479 Bronchiectasis, uncomplicated: Secondary | ICD-10-CM | POA: Insufficient documentation

## 2012-07-08 DIAGNOSIS — R0602 Shortness of breath: Secondary | ICD-10-CM | POA: Insufficient documentation

## 2012-07-08 LAB — RHEUMATOID FACTOR: Rhuematoid fact SerPl-aCnc: <10 IU/mL (ref <=14)

## 2012-07-08 LAB — CYCLIC CITRUL PEPTIDE ANTIBODY, IGG: Cyclic Citrullin Peptide Ab: <2 U/mL (ref 0.0–5.0)

## 2012-07-08 NOTE — Assessment & Plan Note (Signed)
-   First suggested on cxr 12/2004 but clinically limiting activity since 2012 - PFT's 11/24/11  VC 2.94 and dllc 29%, FEV1 80%, ratio 74, no change w/ BD  - 12/23/2011  Walked RA x 2 laps @ 185 ft each stopped due to  desat to 86% -01/14/2012  Walked RA  2 laps @ 185 ft each stopped due to  desat to 87%  - ESR 72 12/23/11  > started steroids 02/11/12 -ESR 02/25/2012 >15  -ONO 02/25/2012 :  02 sats < 89% x 48 sec -Med calendar >02/25/2012  > esr 15 so rec  reduce medrol  to 16 mg daily - 03/09/2012  Walked RA x 3 laps @ 185 ft each stopped due to  desat p 3    - 03/28/2012  Walked RA x 3 laps @ 185 ft each stopped due to  desat p 3 -Reduce medrol to 8 mg daily effective 03/10/12> worse 03/28/2012 so changed to 32 mg daily with esr = 49 -Changed 05/26/12 to self titration Medrol 32 mg per day until better then 16 mg per day as floor - 07/07/2012  Walked RA  2 laps @ 185 ft each stopped due to  desat to 87% - HRCT >>>  I had an extended discussion with the patient today lasting 15 to 20 minutes of a 25 minute visit on the following issues:   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  Discussed in detail all the  indications, usual  risks and alternatives  relative to the benefits with patient who agrees to proceed with screening and consideration for rx.  See instructions for specific recommendations which were reviewed directly with the patient who was given a copy with highlighter outlining the key components.

## 2012-07-12 NOTE — Progress Notes (Signed)
Quick Note:  Spoke with pt and notified of results per Dr. Wert. Pt verbalized understanding and denied any questions.  ______ 

## 2012-08-05 ENCOUNTER — Encounter: Payer: Self-pay | Admitting: Internal Medicine

## 2012-08-05 ENCOUNTER — Ambulatory Visit (INDEPENDENT_AMBULATORY_CARE_PROVIDER_SITE_OTHER): Payer: Medicare Other | Admitting: Internal Medicine

## 2012-08-05 VITALS — BP 124/80 | HR 75 | Ht 70.0 in | Wt 194.2 lb

## 2012-08-05 DIAGNOSIS — J8410 Pulmonary fibrosis, unspecified: Secondary | ICD-10-CM

## 2012-08-05 MED ORDER — UMECLIDINIUM-VILANTEROL 62.5-25 MCG/INH IN AEPB: 1.0000 | INHALATION_SPRAY | Freq: Every day | RESPIRATORY_TRACT | Status: DC

## 2012-08-05 NOTE — Patient Instructions (Addendum)
Order- Schedule 6 MWT before next visit with Dr Sherene Sires  Sample Anoro inhaler     1 puff, one time daily    See if this helps shortness of breath any.  Keep appointment with Dr Sherene Sires later this month

## 2012-08-05 NOTE — Progress Notes (Signed)
Subjective:    Patient ID: James Pope, male    DOB: 26-Jul-1932   MRN: [id number]    Brief patient profile:  79 yobm quit smoking 2000 no respiratory sequelae referred 12/23/2011  by Dr [name] for sob and cough.   12/23/2011 1st pulmonary eval cc indolent onset x one year gradually worse doe 1st with gardening (also knees gave) to point where can no longer do yard work,  Also problem with talking assoc with voice change, cough mostly dry and daytime, not related to meals- tried flovent no better. No h/o RA or unusual exposures. rec Try pantoprazole 40 mg  Take 30-60 min before first meal of the day and Pepcid 20 mg one bedtime    GERD diet  D/c flovent   01/14/2012 f/u ov/Wert cc no better at all x perhaps a bit less cough. rec Stay on pantoprazole and pepcid as before plus observe the GERD diet as best you can Medrol 16 mg 2 daily until coughing and breathing are better then reduce the dose to one pill daily taken at breakfast   02/11/2012 f/u ov/Wert did not start any medrol but worse cough and sob x 3-4 days in terms of breathing and generally confused about all his meds, brings in multiple sheets of lists that don't reconcile with one another.  rec Stay on pantoprazole and pepcid as before plus observe the GERD diet as best you can Medrol(methylprednisolone)  16 mg 2 daily until coughing and breathing are better then reduce the dose to one pill daily taken at breakfast    02/25/12 ov / cough better, breathing not better rec reduce medrol to 16 mg daily Follow med calendar   03/09/2012 f/u ov/Wert no med calendar cc severe muscle cramps, cough resolved, breathing worse on the lower dose of medrol Stop atorvastatin Reduce medrol to 16 mg one half daily Take quinine for cramps   03/28/2012 f/u ov/Wert  Cramps gone never took quinine but cc doe worse on lower dose eg walking around the outside house and down to the street and back to house s stopping, cough a little  more , dry. >>Increased medrol 32mg   daily and decreased atenolol 1/2 daily   04/28/2012 Follow up and Med review  He returns for follow up and med review .  We reviewed all his meds and organized them into a med calendar .  Recent admit for CAP, sepsis (elevated LA and PCT) , tx w/ abx and steroids .  Discharged on Levaquin and Medrol 16mg  daily  Since discharge he is feeling better  CXR today shows chronic scarring, no acute process.  He denies any hemoptysis, chest pain , edema.  Still works on in yard with light activities , ADLS, light chores , ie trash, etc.  Does have intermittent swallow issues w/ food sticking. Has ov with Dr. Fraser Din -PCP this evening to discuss referral to her GI doctor. No n/v, bloody stools or wt loss. No overt reflux on PPI and pepcid  rec Alternate Medrol 16mg  1 tab  And 1/2 tab for 2 weeks  then 1/2 tab daily- hold at this dose until seen back in office  Discuss with Dr. Fraser Din today referral to GI doctor for swallow issues   05/26/2012 f/u ov/Wert re pf/ cramps on medrol 16 mg one half daily  Chief Complaint  Patient presents with  . Follow-up    Pt states DOE progressively worse since his last visit. Has occ non prod cough.  rec Please see patient coordinator before you leave today  to schedule bone density Medrol 16 mg 2 daily until breathing better then one daily    07/07/2012 f/u ov/Wert re PF/ steroid resp component Chief Complaint  Patient presents with  . Follow-up    Pt states DOE continues to worsen since his last appt.    did not benefit from 32 mg medrol and caused hiccups so reduced to 16 mg and these resolved. Says breathing is worse but still able to walk behind a self propelled mower slowest speed (always did slowest) and can do half the yard s stopping (30 min) , breathing worse bending over, does all right at [employer] walking slow.   No obvious daytime variabilty or assoc   cp or chest tightness, subjective wheeze overt sinus or  hb symptoms. No unusual exp hx or h/o childhood pna/ asthma or premature birth to his knowledge.   Sleeping ok without nocturnal  or early am exacerbation  of respiratory  c/o's or need for noct saba. Also denies any obvious fluctuation of symptoms with weather or environmental changes or other aggravating or alleviating factors except as outlined above    08/05/12- Acute visit- Dr Maple Hudson- former smoker-Patient of Dr Sherene Sires with pulmonary fibrosis. MW pt.  Reports breathing continues to get worse.  Has increased SOB when walking from one room to another.  Chest pain at times.  Notes nonprod cough at times as well.  No wheezing.  Trouble swallowing over the past few weeks. He complains that Medrol is only swelling him up, not helping his breathing. Gradually worse dyspnea on exertion in his home. Has had some discussion about perfenadone. CT chest 07/12/12 IMPRESSION:  No acute finding.  Changes of interstitial fibrosis and paraseptal emphysema are  stable from the prior study.  Original Report Authenticated By: Amie Portland, M.D.  Current Medications, Allergies, Past Medical History, Past Surgical History, Family History, and Social History were reviewed in [employer] record.  ROS  The following are not active complaints unless bolded sore throat, dysphagia, dental problems, itching, sneezing,  nasal congestion or excess/ purulent secretions, ear ache,   fever, chills, sweats, unintended wt loss, pleuritic or exertional cp, hemoptysis,  orthopnea pnd or leg swelling, presyncope, palpitations, heartburn, abdominal pain, anorexia, nausea, vomiting, diarrhea  or change in bowel or urinary habits, change in stools or urine, dysuria,hematuria,  rash, arthralgias, visual complaints, headache, numbness weakness or ataxia or problems with walking or coordination,  change in mood/affect or memory.    Objective:   Physical Exam BP 124/80  Pulse 75  Ht 5\' 10"  (1.778 m)  Wt 194 lb 3.2  oz (88.089 kg)  BMI 27.86 kg/m2  SpO2 96% Wt 03/09/2012  192 >  195 03/28/2012 > 191 04/28/2012 > 05/26/2012  192> 196 07/07/12 Mod  hoarse amb wm nad with nl vital signs mod cushingnoid Full set of dentures Gen: No acute distress. Well nourished and well groomed.  Neurological: Alert and oriented to person, place, and time. Coordination normal.  Head: Normocephalic and atraumatic.  Eyes: Conjunctivae are normal. . No scleral icterus.  Neck: Normal range of motion. Neck supple. No tracheal deviation or thyromegaly present. No cervical lymphadenopathy.  Cardiovascular: Normal rate, regular rhythm, normal heart sounds and intact distal pulses. Exam reveals no gallop and no friction rub. No murmur heard.  Respiratory: Effort normal. No respiratory distress. No chest wall tenderness. Breath sounds normal. Very minimal insp crackles bilaterally s cough. Expiratory wheeze  upper zones GI: Soft. Bowel sounds are normal. The abdomen is soft and nontender. There is no rebound and no guarding.  Musculoskeletal: Normal range of motion. Extremities are nontender.  Skin: Skin is warm and dry. No rash noted. No diaphoresis. No erythema. No pallor. No clubbing, cyanosis, or edema.  Psychiatric: Normal mood and affect. Behavior is normal. Judgment and thought content normal.      04/28/2012 CXR Stable basilar fibrosis and mild cardiomegaly. No active lung  disease.    03/28/2012 ESR on 8 mg per day = 49       Assessment & Plan:

## 2012-08-08 ENCOUNTER — Ambulatory Visit (INDEPENDENT_AMBULATORY_CARE_PROVIDER_SITE_OTHER): Payer: Medicare Other | Admitting: Internal Medicine

## 2012-08-08 DIAGNOSIS — R0609 Other forms of dyspnea: Secondary | ICD-10-CM

## 2012-08-08 DIAGNOSIS — J8410 Pulmonary fibrosis, unspecified: Secondary | ICD-10-CM

## 2012-08-18 ENCOUNTER — Encounter: Payer: Self-pay | Admitting: Internal Medicine

## 2012-08-18 ENCOUNTER — Ambulatory Visit (INDEPENDENT_AMBULATORY_CARE_PROVIDER_SITE_OTHER): Payer: Medicare Other | Admitting: Internal Medicine

## 2012-08-18 VITALS — BP 130/90 | HR 87 | Temp 97.0°F | Ht 70.0 in | Wt 196.1 lb

## 2012-08-18 DIAGNOSIS — J8410 Pulmonary fibrosis, unspecified: Secondary | ICD-10-CM

## 2012-08-18 DIAGNOSIS — J9610 Chronic respiratory failure, unspecified whether with hypoxia or hypercapnia: Secondary | ICD-10-CM

## 2012-08-18 DIAGNOSIS — R609 Edema, unspecified: Secondary | ICD-10-CM | POA: Insufficient documentation

## 2012-08-18 MED ORDER — FUROSEMIDE 20 MG PO TABS: ORAL_TABLET | ORAL | Status: DC

## 2012-08-18 NOTE — Progress Notes (Signed)
Subjective:    Patient ID: James Pope, male    DOB: [birth year]   MRN: [id number]    Brief patient profile:  79 yobm quit smoking 2000 no respiratory sequelae referred 12/23/2011  by Dr [name] for sob and cough and proved to have PF ? Etiology assoc with high ESR  HPI 12/23/2011 1st pulmonary eval cc indolent onset x one year gradually worse doe 1st with gardening (also knees gave) to point where can no longer do yard work,  Also problem with talking assoc with voice change, cough mostly dry and daytime, not related to meals- tried flovent no better. No h/o RA or unusual exposures. rec Try pantoprazole 40 mg  Take 30-60 min before first meal of the day and Pepcid 20 mg one bedtime    GERD diet  D/c flovent   01/14/2012 f/u ov/Wert cc no better at all x perhaps a bit less cough. rec Stay on pantoprazole and pepcid as before plus observe the GERD diet as best you can Medrol 16 mg 2 daily until coughing and breathing are better then reduce the dose to one pill daily taken at breakfast   02/11/2012 f/u ov/Wert did not start any medrol but worse cough and sob x 3-4 days in terms of breathing and generally confused about all his meds, brings in multiple sheets of lists that don't reconcile with one another.  rec Stay on pantoprazole and pepcid as before plus observe the GERD diet as best you can Medrol(methylprednisolone)  16 mg 2 daily until coughing and breathing are better then reduce the dose to one pill daily taken at breakfast    02/25/12 ov / cough better, breathing not better rec reduce medrol to 16 mg daily Follow med calendar   03/09/2012 f/u ov/Wert no med calendar cc severe muscle cramps, cough resolved, breathing worse on the lower dose of medrol Stop atorvastatin Reduce medrol to 16 mg one half daily Take quinine for cramps   03/28/2012 f/u ov/Wert  Cramps gone never took quinine but cc doe worse on lower dose eg walking around the outside house and down to the  street and back to house s stopping, cough a little more , dry. >>Increased medrol 32mg   daily and decreased atenolol 1/2 daily   04/28/2012 Follow up and Med review  He returns for follow up and med review .  We reviewed all his meds and organized them into a med calendar .  Recent admit for CAP, sepsis (elevated LA and PCT) , tx w/ abx and steroids .  Discharged on Levaquin and Medrol 16mg  daily  Since discharge he is feeling better  CXR today shows chronic scarring, no acute process.  He denies any hemoptysis, chest pain , edema.  Still works on in yard with light activities , ADLS, light chores , ie trash, etc.  Does have intermittent swallow issues w/ food sticking. Has ov with Dr. Fraser Din -PCP this evening to discuss referral to her GI doctor. No n/v, bloody stools or wt loss. No overt reflux on PPI and pepcid  rec Alternate Medrol 16mg  1 tab  And 1/2 tab for 2 weeks  then 1/2 tab daily- hold at this dose until seen back in office  Discuss with Dr. Fraser Din today referral to GI doctor for swallow issues   05/26/2012 f/u ov/Wert re pf/ cramps on medrol 16 mg one half daily  Chief Complaint  Patient presents with  . Follow-up    Pt states DOE progressively worse  since his last visit. Has occ non prod cough.   rec Please see patient coordinator before you leave today  to schedule bone density Medrol 16 mg 2 daily until breathing better then one daily    07/07/2012 f/u ov/Wert re PF/ steroid resp component Chief Complaint  Patient presents with  . Follow-up    Pt states DOE continues to worsen since his last appt.    did not benefit from 32 mg medrol and caused hiccups so reduced to 16 mg and these resolved. Says breathing is worse but still able to walk behind a self propelled mower slowest speed (always did slowest) and can do half the yard s stopping (30 min) , breathing worse bending over, does all right at [employer] walking slow. rec maintaint medrol at 16 mg per day  08/05/12  Dr young eval > rec add anoro    08/18/2012 f/u ov/Wert re copd/ pf ? Some better while on anoro Chief Complaint  Patient presents with  . Follow-up    Pt reports breathing is progressively worse since his last visit. No new co's today.   on medrol 16 mg daily with esr down to 24 on this dose. Does qualify for amb 02 but not using.   No obvious daytime variabilty or assoc cough  cp or chest tightness, subjective wheeze overt sinus or hb symptoms. No unusual exp hx or h/o childhood pna/ asthma or premature birth to his knowledge.   Sleeping ok without nocturnal  or early am exacerbation  of respiratory  c/o's or need for noct saba. Also denies any obvious fluctuation of symptoms with weather or environmental changes or other aggravating or alleviating factors except as outlined above   Current Medications, Allergies, Past Medical History, Past Surgical History, Family History, and Social History were reviewed in [employer] record.  ROS  The following are not active complaints unless bolded sore throat, dysphagia, dental problems, itching, sneezing,  nasal congestion or excess/ purulent secretions, ear ache,   fever, chills, sweats, unintended wt loss, pleuritic or exertional cp, hemoptysis,  orthopnea pnd or leg swelling, presyncope, palpitations, heartburn, abdominal pain, anorexia, nausea, vomiting, diarrhea  or change in bowel or urinary habits, change in stools or urine, dysuria,hematuria,  rash, arthralgias, visual complaints, headache, numbness weakness or ataxia or problems with walking or coordination,  change in mood/affect or memory.                      Objective:   Physical Exam  Wt 03/09/2012  192 >  195 03/28/2012 > 191 04/28/2012 > 05/26/2012  192> 196 07/07/12> 08/18/2012  196 Mod  hoarse amb wm nad with nl vital signs mod cushingnoid Full set of dentures Gen: No acute distress. Well nourished and well groomed.  Neurological: Alert and oriented to  person, place, and time. Coordination normal.  Head: Normocephalic and atraumatic.  Eyes: Conjunctivae are normal. . No scleral icterus.  Neck: Normal range of motion. Neck supple. No tracheal deviation or thyromegaly present. No cervical lymphadenopathy.  Cardiovascular: Normal rate, regular rhythm, normal heart sounds and intact distal pulses. Exam reveals no gallop and no friction rub. No murmur heard.  Respiratory: Effort normal. No respiratory distress. No chest wall tenderness. Breath sounds normal. Very minimal insp crackles bilaterally s cough GI: Soft. Bowel sounds are normal. The abdomen is soft and nontender. There is no rebound and no guarding.  Musculoskeletal: Normal range of motion. Extremities are nontender.  Skin: Skin  is warm and dry. No rash noted. No diaphoresis. No erythema. No pallor. No clubbing, cyanosis,  1+ sym edema. Psychiatric: Normal mood and affect. Behavior is normal. Judgment and thought content normal.      04/28/2012 CXR Stable basilar fibrosis and mild cardiomegaly. No active lung  disease.    03/28/2012 ESR on 8 mg per day = 49 down to 24 on 16 mg daily 07/07/12       Assessment & Plan:

## 2012-08-18 NOTE — Patient Instructions (Addendum)
Please see patient coordinator before you leave today  to schedule ambulatory 02 @ 2lpm with exertion  Continue anoro one puff daily  Add lasix 20 mg one daily as needed for swelling  I will submit your chart today for review the new medication.   See Tammy NP in 4 weeks with all your medications, even over the counter meds, separated in two separate bags, the ones you take no matter what vs the ones you stop once you feel better and take only as needed when you feel you need them.   Tammy  will generate for you a new user friendly medication calendar that will put [place] all on the same page re: your medication use.     Without this process, it simply isn't possible to assure that we are providing  your outpatient care  with  the attention to detail we feel you deserve.   If we cannot assure that you're getting that kind of care,  then we cannot manage your problem effectively from this clinic.  Once you have seen Tammy and we are sure that we're all on the same page with your medication use she will arrange follow up with me.

## 2012-08-19 NOTE — Assessment & Plan Note (Signed)
-   First suggested on cxr 12/2004 but clinically limiting activity since 2012 - PFT's 11/24/11  VC 2.94 and dllc 29%, FEV1 80%, ratio 74, no change w/ BD  -Med calendar >02/25/2012  > esr 15 so rec  reduce medrol  to 16 mg daily - 03/09/2012  Walked RA x 3 laps @ 185 ft each stopped due to  desat p 3    - 03/28/2012  Walked RA x 3 laps @ 185 ft each stopped due to  desat p 3 -Reduce medrol to 8 mg daily effective 03/10/12> worse 03/28/2012 so changed to 32 mg daily with esr = 49 -Changed 05/26/12 to self titration Medrol 32 mg per day until better then 16 mg per day as floor - 07/07/2012  Walked RA  2 laps @ 185 ft each stopped due to  desat to 87% - HRCT  07/08/12 > No acute finding.  Changes of interstitial fibrosis and paraseptal emphysema are  stable from the prior study. - submitted for perfenidone review  08/19/2012  Not clear this is classic uip by ct but whatever he has appears to be partially steroid resp with neg collagen vasc screen    Each maintenance medication was reviewed in detail including most importantly the difference between maintenance and as needed and under what circumstances the prns are to be used. This was done in the context of a medication calendar review which provided the patient with a user-friendly unambiguous mechanism for medication administration and reconciliation and provides an action plan for all active problems. It is critical that this be shown to every doctor  for modification during the office visit if necessary so the patient can use it as a working document.

## 2012-08-19 NOTE — Assessment & Plan Note (Addendum)
-   12/23/2011  Walked RA x 2 laps @ 185 ft each stopped due to  desat to 86% -01/14/2012  Walked RA  2 laps @ 185 ft each stopped due to  desat to 87%  -ONO 02/25/2012 :  02 sats < 89% x 48 sec > declined 02 - 07/07/2012  Walked RA  2 laps @ 185 ft each stopped due to  desat to 87% - 08/18/2012  Walked RA x 3 laps @ 185 ft each stopped due to  Sob and sat 88 > rx 2lpm > 3 laps s sob or desat  rx = 2lpm with activities outside the house  > ordered, reviewed with pt

## 2012-08-19 NOTE — Assessment & Plan Note (Signed)
Add prn lasix 20 mg daily to offset fluid retention from steroids

## 2012-08-21 NOTE — Assessment & Plan Note (Signed)
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 He blames Medrol 4 some of his discomfort and we discussed weaning. Question if he needs echocardiogram and diuresis. Plan-6 minute walk test before next visit with Dr. Sherene Sires for O2 assessment. Sample Anoro to see if he notices any effect. Staff will check with research team about profound own application.

## 2012-08-21 NOTE — Progress Notes (Signed)
Documentation for 6 minute walk test 

## 2012-08-22 ENCOUNTER — Other Ambulatory Visit: Payer: Self-pay | Admitting: Internal Medicine

## 2012-08-31 ENCOUNTER — Other Ambulatory Visit: Payer: Self-pay

## 2012-08-31 MED ORDER — ATENOLOL 25 MG PO TABS: 25.0000 mg | ORAL_TABLET | Freq: Every day | ORAL | Status: DC

## 2012-08-31 NOTE — Telephone Encounter (Signed)
Rx was sent to pharmacy electronically. 

## 2012-09-15 ENCOUNTER — Ambulatory Visit (INDEPENDENT_AMBULATORY_CARE_PROVIDER_SITE_OTHER): Payer: Medicare Other | Admitting: Adult Health

## 2012-09-15 ENCOUNTER — Encounter: Payer: Self-pay | Admitting: Adult Health

## 2012-09-15 ENCOUNTER — Telehealth: Payer: Self-pay | Admitting: *Deleted

## 2012-09-15 VITALS — BP 124/72 | HR 66 | Temp 98.8°F | Ht 70.0 in | Wt 194.4 lb

## 2012-09-15 DIAGNOSIS — J8410 Pulmonary fibrosis, unspecified: Secondary | ICD-10-CM

## 2012-09-15 NOTE — Assessment & Plan Note (Signed)
Compensated on present regimen Patient's medications were reviewed today and patient education was given. Computerized medication calendar was adjusted/completed  Plan  Cont on current regimen  Follow up with Dr. Sherene Sires  In 6 weeks and As needed

## 2012-09-15 NOTE — Patient Instructions (Addendum)
Follow med calendar closely and bring to each visit  follow up Dr. Wert  In 6 weeks and As needed   

## 2012-09-15 NOTE — Telephone Encounter (Signed)
LMTCB- needs ov with PFT to try an qualify for perfenidone

## 2012-09-15 NOTE — Telephone Encounter (Signed)
Message copied by Christen Butter on Thu Sep 15, 2012  4:02 PM ------      Message from: Sandrea Hughs B      Created: Thu Sep 15, 2012 12:22 PM       No but worth a try, will let him know and set up f/u ov with me for this             ----- Message -----         From: Rosita Fire, RN         Sent: 09/15/2012  10:38 AM           To: Nyoka Cowden, MD            Dr. Sherene Sires,      His DLCO is 27 and has to be 30% or more.  Do you think he would be able to improve with another function test?            ----- Message -----         From: Kalman Shan, MD         Sent: 09/08/2012  11:59 AM           To: Rosita Fire, RN            CT is defnite from 2014. PFT dlco is low ui 2013      ----- Message -----         From: Rosita Fire, RN         Sent: 09/05/2012  12:20 PM           To: Kalman Shan, MD            Please review an let me know so that I may proceed or exclude.      ----- Message -----         From: Nyoka Cowden, MD         Sent: 08/21/2012   1:05 PM           To: Rosita Fire, RN            Consider for perfenidone                         ------

## 2012-09-15 NOTE — Progress Notes (Signed)
Subjective:    Patient ID: James Pope, male    DOB: 1932-03-24   MRN: [id number]  Brief patient profile:  79 yobm quit smoking 2000 no respiratory sequelae referred 12/23/2011  by Dr [name] for sob and cough and proved to have PF ? Etiology assoc with high ESR  HPI 12/23/2011 1st pulmonary eval cc indolent onset x one year gradually worse doe 1st with gardening (also knees gave) to point where can no longer do yard work,  Also problem with talking assoc with voice change, cough mostly dry and daytime, not related to meals- tried flovent no better. No h/o RA or unusual exposures. rec Try pantoprazole 40 mg  Take 30-60 min before first meal of the day and Pepcid 20 mg one bedtime    GERD diet  D/c flovent   01/14/2012 f/u ov/Wert cc no better at all x perhaps a bit less cough. rec Stay on pantoprazole and pepcid as before plus observe the GERD diet as best you can Medrol 16 mg 2 daily until coughing and breathing are better then reduce the dose to one pill daily taken at breakfast   02/11/2012 f/u ov/Wert did not start any medrol but worse cough and sob x 3-4 days in terms of breathing and generally confused about all his meds, brings in multiple sheets of lists that don't reconcile with one another.  rec Stay on pantoprazole and pepcid as before plus observe the GERD diet as best you can Medrol(methylprednisolone)  16 mg 2 daily until coughing and breathing are better then reduce the dose to one pill daily taken at breakfast    02/25/12 ov / cough better, breathing not better rec reduce medrol to 16 mg daily Follow med calendar   03/09/2012 f/u ov/Wert no med calendar cc severe muscle cramps, cough resolved, breathing worse on the lower dose of medrol Stop atorvastatin Reduce medrol to 16 mg one half daily Take quinine for cramps   03/28/2012 f/u ov/Wert  Cramps gone never took quinine but cc doe worse on lower dose eg walking around the outside house and down to the  street and back to house s stopping, cough a little more , dry. >>Increased medrol 32mg   daily and decreased atenolol 1/2 daily   04/28/2012 Follow up and Med review  He returns for follow up and med review .  We reviewed all his meds and organized them into a med calendar .  Recent admit for CAP, sepsis (elevated LA and PCT) , tx w/ abx and steroids .  Discharged on Levaquin and Medrol 16mg  daily  Since discharge he is feeling better  CXR today shows chronic scarring, no acute process.  He denies any hemoptysis, chest pain , edema.  Still works on in yard with light activities , ADLS, light chores , ie trash, etc.  Does have intermittent swallow issues w/ food sticking. Has ov with Dr. Fraser Din -PCP this evening to discuss referral to her GI doctor. No n/v, bloody stools or wt loss. No overt reflux on PPI and pepcid  rec Alternate Medrol 16mg  1 tab  And 1/2 tab for 2 weeks  then 1/2 tab daily- hold at this dose until seen back in office  Discuss with Dr. Fraser Din today referral to GI doctor for swallow issues   05/26/2012 f/u ov/Wert re pf/ cramps on medrol 16 mg one half daily  Chief Complaint  Patient presents with  . Follow-up    Pt states DOE progressively worse since his  last visit. Has occ non prod cough.   rec Please see patient coordinator before you leave today  to schedule bone density Medrol 16 mg 2 daily until breathing better then one daily    07/07/2012 f/u ov/Wert re PF/ steroid resp component Chief Complaint  Patient presents with  . Follow-up    Pt states DOE continues to worsen since his last appt.    did not benefit from 32 mg medrol and caused hiccups so reduced to 16 mg and these resolved. Says breathing is worse but still able to walk behind a self propelled mower slowest speed (always did slowest) and can do half the yard s stopping (30 min) , breathing worse bending over, does all right at [employer] walking slow. rec maintaint medrol at 16 mg per day  08/05/12  Dr young eval > rec add anoro    08/18/2012 f/u ov/Wert re copd/ pf ? Some better while on anoro Chief Complaint  Patient presents with  . Follow-up    Pt reports breathing is progressively worse since his last visit. No new co's today.   on medrol 16 mg daily with esr down to 24 on this dose. Does qualify for amb 02 but not using.  >>O2 started with ambulation, chart review for perfenidone, and lasix daily As needed  Added.   09/15/2012 Follow up and Med review.  Patient returns for 1 month followup and medication review. Reviewed all his medications and organized them into a medication calendar with patient education. Appears that he is taking his medications correctly. Patient was started on oxygen last visit to be used with walking. Feels O2 is really helping him with walking. Is more active now .  He was also started on Lasix 20 mg be used daily as needed. For any lower extremity swelling. This has also been helpful. Patient denies any chest pain, orthopnea, PND, leg swelling.    Current Medications, Allergies, Past Medical History, Past Surgical History, Family History, and Social History were reviewed in [employer] record.  ROS  The following are not active complaints unless bolded sore throat, dysphagia, dental problems, itching, sneezing,  nasal congestion or excess/ purulent secretions, ear ache,   fever, chills, sweats, unintended wt loss, pleuritic or exertional cp, hemoptysis,  orthopnea pnd or leg swelling, presyncope, palpitations, heartburn, abdominal pain, anorexia, nausea, vomiting, diarrhea  or change in bowel or urinary habits, change in stools or urine, dysuria,hematuria,  rash, arthralgias, visual complaints, headache, numbness weakness or ataxia or problems with walking or coordination,  change in mood/affect or memory.                      Objective:   Physical Exam  Wt 03/09/2012  192 >  195 03/28/2012 > 191 04/28/2012 > 05/26/2012   192> 196 07/07/12> 08/18/2012  196>194 09/15/2012  Mod  hoarse amb wm nad with nl vital signs mod cushingnoid Full set of dentures Gen: No acute distress. Well nourished and well groomed.  Neurological: Alert and oriented to person, place, and time. Coordination normal.  Head: Normocephalic and atraumatic.  Eyes: Conjunctivae are normal. . No scleral icterus.  Neck: Normal range of motion. Neck supple. No tracheal deviation or thyromegaly present. No cervical lymphadenopathy.  Cardiovascular: Normal rate, regular rhythm, normal heart sounds and intact distal pulses. Exam reveals no gallop and no friction rub. No murmur heard.  Respiratory: Effort normal. No respiratory distress. No chest wall tenderness. Breath sounds normal. Very minimal  insp crackles bilaterally s cough GI: Soft. Bowel sounds are normal. The abdomen is soft and nontender. There is no rebound and no guarding.  Musculoskeletal: Normal range of motion. Extremities are nontender.  Skin: Skin is warm and dry. No rash noted. No diaphoresis. No erythema. No pallor. No clubbing, cyanosis,  1+ sym edema. Psychiatric: Normal mood and affect. Behavior is normal. Judgment and thought content normal.      04/28/2012 CXR Stable basilar fibrosis and mild cardiomegaly. No active lung  disease.    03/28/2012 ESR on 8 mg per day = 49 down to 24 on 16 mg daily 07/07/12       Assessment & Plan:

## 2012-09-16 NOTE — Addendum Note (Signed)
Addended by: Gwynneth Aliment A on: 09/16/2012 05:26 PM   Modules accepted: Orders, Medications

## 2012-09-21 NOTE — Telephone Encounter (Signed)
Pt returned call

## 2012-09-21 NOTE — Telephone Encounter (Signed)
ATC, NA and no option to leave a msg, WCB 

## 2012-09-21 NOTE — Telephone Encounter (Signed)
Scheduled

## 2012-09-22 ENCOUNTER — Encounter: Payer: Self-pay | Admitting: Internal Medicine

## 2012-09-22 ENCOUNTER — Ambulatory Visit (INDEPENDENT_AMBULATORY_CARE_PROVIDER_SITE_OTHER): Payer: Medicare Other | Admitting: Internal Medicine

## 2012-09-22 VITALS — BP 102/60 | HR 94 | Temp 97.6°F | Ht 70.0 in | Wt 193.0 lb

## 2012-09-22 DIAGNOSIS — Z23 Encounter for immunization: Secondary | ICD-10-CM

## 2012-09-22 DIAGNOSIS — J8410 Pulmonary fibrosis, unspecified: Secondary | ICD-10-CM

## 2012-09-22 DIAGNOSIS — J9610 Chronic respiratory failure, unspecified whether with hypoxia or hypercapnia: Secondary | ICD-10-CM

## 2012-09-22 DIAGNOSIS — R609 Edema, unspecified: Secondary | ICD-10-CM

## 2012-09-22 DIAGNOSIS — J438 Other emphysema: Secondary | ICD-10-CM

## 2012-09-22 LAB — PULMONARY FUNCTION TEST

## 2012-09-22 MED ORDER — FUROSEMIDE 20 MG PO TABS: ORAL_TABLET | ORAL | Status: DC

## 2012-09-22 NOTE — Assessment & Plan Note (Addendum)
-   First suggested on cxr 12/2004 but clinically limiting activity since 2012 - PFT's 11/24/11  VC 2.94 and dllc 29%, FEV1 80%, ratio 74, no change w/ BD  -Med calendar >02/25/2012  > esr 15 so rec  reduce medrol  to 16 mg daily - 03/09/2012  Walked RA x 3 laps @ 185 ft each stopped due to  desat p 3    - 03/28/2012  Walked RA x 3 laps @ 185 ft each stopped due to  desat p 3 -Reduce medrol to 8 mg daily effective 03/10/12> worse 03/28/2012 so changed to 32 mg daily with esr = 49 -Changed 05/26/12 to self titration Medrol 32 mg per day until better then 16 mg per day as floor - 07/07/2012  Walked RA  2 laps @ 185 ft each stopped due to  desat to 87% -6 MWT- 08/08/12- 95%, 84%, 96% , 312 m. 2 L oxygen added with subsequent desaturation no lower than 90%. - - 08/08/12-  - HRCT  07/08/12 > No acute finding.  Changes of interstitial fibrosis and paraseptal emphysema are  stable from the prior study. - submitted for perfenidone review  08/19/2012 > did not qualify due to dlco too low  - PFTs 09/22/2012 VC 2.62 no obst and DLCO 20% corrects to 36 - 09/23/2012  Walked RA  2 laps @ 185 ft each stopped due to  88%   Not eligible for the study drug, no choice but continue medrol 16 mg daily which has actually stabilized/ improved his 02 sats

## 2012-09-22 NOTE — Progress Notes (Signed)
Subjective:    Patient ID: James Pope, male    DOB: Dec 15, 1932   MRN: [id number]  Brief patient profile:  79 yobm quit smoking 2000 no respiratory sequelae referred 12/23/2011  by Dr [name] for sob and cough and proved to have PF ? Etiology assoc with high ESR  HPI 12/23/2011 1st pulmonary eval cc indolent onset x one year gradually worse doe 1st with gardening (also knees gave) to point where can no longer do yard work,  Also problem with talking assoc with voice change, cough mostly dry and daytime, not related to meals- tried flovent no better. No h/o RA or unusual exposures. rec Try pantoprazole 40 mg  Take 30-60 min before first meal of the day and Pepcid 20 mg one bedtime    GERD diet  D/c flovent   01/14/2012 f/u ov/James Pope cc no better at all x perhaps a bit less cough. rec Stay on pantoprazole and pepcid as before plus observe the GERD diet as best you can Medrol 16 mg 2 daily until coughing and breathing are better then reduce the dose to one pill daily taken at breakfast   02/11/2012 f/u ov/James Pope did not start any medrol but worse cough and sob x 3-4 days in terms of breathing and generally confused about all his meds, brings in multiple sheets of lists that don't reconcile with one another.  rec Stay on pantoprazole and pepcid as before plus observe the GERD diet as best you can Medrol(methylprednisolone)  16 mg 2 daily until coughing and breathing are better then reduce the dose to one pill daily taken at breakfast    02/25/12 ov / cough better, breathing not better rec reduce medrol to 16 mg daily Follow med calendar   03/09/2012 f/u ov/James Pope no med calendar cc severe muscle cramps, cough resolved, breathing worse on the lower dose of medrol Stop atorvastatin Reduce medrol to 16 mg one half daily Take quinine for cramps   03/28/2012 f/u ov/James Pope  Cramps gone never took quinine but cc doe worse on lower dose eg walking around the outside house and down to the  street and back to house s stopping, cough a little more , dry. >>Increased medrol 32mg   daily and decreased atenolol 1/2 daily   04/28/2012 Follow up and Med review  He returns for follow up and med review .  We reviewed all his meds and organized them into a med calendar .  Recent admit for CAP, sepsis (elevated LA and PCT) , tx w/ abx and steroids .  Discharged on Levaquin and Medrol 16mg  daily  Since discharge he is feeling better  CXR today shows chronic scarring, no acute process.  He denies any hemoptysis, chest pain , edema.  Still works on in yard with light activities , ADLS, light chores , ie trash, etc.  Does have intermittent swallow issues w/ food sticking. Has ov with Dr. Fraser Din -PCP this evening to discuss referral to her GI doctor. No n/v, bloody stools or wt loss. No overt reflux on PPI and pepcid  rec Alternate Medrol 16mg  1 tab  And 1/2 tab for 2 weeks  then 1/2 tab daily- hold at this dose until seen back in office  Discuss with Dr. Fraser Din today referral to GI doctor for swallow issues   05/26/2012 f/u ov/James Pope re pf/ cramps on medrol 16 mg one half daily  Chief Complaint  Patient presents with  . Follow-up    Pt states DOE progressively worse since his  last visit. Has occ non prod cough.   rec Please see patient coordinator before you leave today  to schedule bone density Medrol 16 mg 2 daily until breathing better then one daily    07/07/2012 f/u ov/James Pope re PF/ steroid resp component Chief Complaint  Patient presents with  . Follow-up    Pt states DOE continues to worsen since his last appt.    did not benefit from 32 mg medrol and caused hiccups so reduced to 16 mg and these resolved. Says breathing is worse but still able to walk behind a self propelled mower slowest speed (always did slowest) and can do half the yard s stopping (30 min) , breathing worse bending over, does all right at [employer] walking slow. rec maintaint medrol at 16 mg per day  08/05/12  Dr young eval > rec add anoro    08/18/2012 f/u ov/James Pope re copd/ pf ? Some better while on anoro Chief Complaint  Patient presents with  . Follow-up    Pt reports breathing is progressively worse since his last visit. No new co's today.   on medrol 16 mg daily with esr down to 24 on this dose. Does qualify for amb 02 but not using.  >>Please see patient coordinator before you leave today  to schedule ambulatory 02 @ 2lpm with exertion Continue anoro one puff daily Add lasix 20 mg one daily as needed for swelling     09/15/2012 Follow up and Med review.  Patient returns for 1 month followup and medication review. Reviewed all his medications and organized them into a medication calendar with patient education. Appears that he is taking his medications correctly. Patient was started on oxygen last visit to be used with walking. Feels O2 is really helping him with walking. Is more active now .  He was also started on Lasix 20 mg be used daily as needed. For any lower extremity swelling. This has also been helpful. rec No change rx   09/22/2012 f/u ov/James Pope re PF Chief Complaint  Patient presents with  . Follow-up    Pt reports breathing unchanged since last visit. No new co's today.  RA on arrival at 92 % and denies being limited by breathing walking at [employer] on RA, typically uses 02 only  after exertion, not during.   No obvious day to day or  daytime variabilty or assoc chronic cough or cp or chest tightness, subjective wheeze overt sinus or hb symptoms. No unusual exp hx or h/o childhood pna/ asthma or knowledge of premature birth.   Sleeping ok without nocturnal  or early am exacerbation  of respiratory  c/o's or need for noct saba. Also denies any obvious fluctuation of symptoms with weather or environmental changes or other aggravating or alleviating factors except as outlined above    Current Medications, Allergies, Past Medical History, Past Surgical History, Family History,  and Social History were reviewed in [employer] record.  ROS  The following are not active complaints unless bolded sore throat, dysphagia, dental problems, itching, sneezing,  nasal congestion or excess/ purulent secretions, ear ache,   fever, chills, sweats, unintended wt loss, pleuritic or exertional cp, hemoptysis,  orthopnea pnd or leg swelling, presyncope, palpitations, heartburn, abdominal pain, anorexia, nausea, vomiting, diarrhea  or change in bowel or urinary habits, change in stools or urine, dysuria,hematuria,  rash, arthralgias, visual complaints, headache, numbness weakness or ataxia or problems with walking or coordination,  change in mood/affect or memory.  Objective:   Physical Exam  Wt 03/09/2012  192 >  195 03/28/2012 > 191 04/28/2012 > 05/26/2012  192> 196 07/07/12> 08/18/2012  196>194 09/15/2012 > 09/22/2012 193  Mod  hoarse amb wm nad with nl vital signs mod cushingnoid Full set of dentures Gen: No acute distress. Well nourished and well groomed.  Neurological: Alert and oriented to person, place, and time. Coordination normal.  Head: Normocephalic and atraumatic.  Eyes: Conjunctivae are normal. . No scleral icterus.  Neck: Normal range of motion. Neck supple. No tracheal deviation or thyromegaly present. No cervical lymphadenopathy.  Cardiovascular: Normal rate, regular rhythm, normal heart sounds and intact distal pulses. Exam reveals no gallop and no friction rub. No murmur heard.  Respiratory: Effort normal. No respiratory distress. No chest wall tenderness. Breath sounds normal. Very minimal insp crackles bilaterally s cough GI: Soft. Bowel sounds are normal. The abdomen is soft and nontender. There is no rebound and no guarding.  Musculoskeletal: Normal range of motion. Extremities are nontender.  Skin: Skin is warm and dry. No rash noted. No diaphoresis. No erythema. No pallor. No clubbing, cyanosis,  1+ sym  edema. Psychiatric: Normal mood and affect. Behavior is normal. Judgment and thought content normal.      04/28/2012 CXR Stable basilar fibrosis and mild cardiomegaly. No active lung  disease.    03/28/2012 ESR on 8 mg per day = 49 down to 24 on 16 mg daily 07/07/12       Assessment & Plan:

## 2012-09-22 NOTE — Patient Instructions (Addendum)
Stop atenolol  Change furosemide to where you take it every day and then take another one as needed for swelling Wear 02 2lpm with sustained walking more than 100 ft   Please schedule a follow up office visit in 6 weeks, call sooner if needed

## 2012-09-22 NOTE — Progress Notes (Signed)
PFT done today. 

## 2012-09-25 DIAGNOSIS — J438 Other emphysema: Secondary | ICD-10-CM | POA: Insufficient documentation

## 2012-09-25 NOTE — Assessment & Plan Note (Signed)
CT dx only as doe not have sign airflow obst by pft's > continue anoro for now    Each maintenance medication was reviewed in detail including most importantly the difference between maintenance and as needed and under what circumstances the prns are to be used.  Please see instructions for details which were reviewed in writing and the patient given a copy.

## 2012-09-25 NOTE — Assessment & Plan Note (Signed)
-   12/23/2011  Walked RA x 2 laps @ 185 ft each stopped due to  desat to 86% -01/14/2012  Walked RA  2 laps @ 185 ft each stopped due to  desat to 87%  -ONO 02/25/2012 :  02 sats < 89% x 48 sec > declined 02 - 07/07/2012  Walked RA  2 laps @ 185 ft each stopped due to  desat to 87% - 08/18/2012  Walked RA x 3 laps @ 185 ft each stopped due to  Sob and sat 88 > rx 2lpm > 3 laps s sob or desat - - 09/23/2012  Walked RA  2 laps @ 185 ft each stopped due to  88%  rx = 2lpm with activities outside the house as of  09/25/2012

## 2012-09-27 ENCOUNTER — Telehealth: Payer: Self-pay | Admitting: Cardiovascular Disease

## 2012-09-27 NOTE — Telephone Encounter (Signed)
Patient's pulmonary doc took him off of Atenolol.  Wants to make sure it's OK with Dr. [name].

## 2012-09-29 ENCOUNTER — Other Ambulatory Visit: Payer: Self-pay | Admitting: Internal Medicine

## 2012-09-29 NOTE — Telephone Encounter (Signed)
Message forwarded to Dr. Kelly.

## 2012-09-29 NOTE — Telephone Encounter (Signed)
Still waiting to find out if Dr [name] wants him to stay on his Atenolol?

## 2012-09-29 NOTE — Telephone Encounter (Signed)
Pull office chart for me review

## 2012-09-30 NOTE — Telephone Encounter (Signed)
Paper chart requested.

## 2012-09-30 NOTE — Telephone Encounter (Signed)
Paper chart [id number] received and placed on Dr. Landry Dyke cart.

## 2012-10-04 NOTE — Telephone Encounter (Signed)
Pt taking 1/2 tab daily, advised to take 1/2 qod x 10d then d/c.  Pt voiced understanding

## 2012-10-27 ENCOUNTER — Ambulatory Visit: Payer: Medicare Other | Admitting: Internal Medicine

## 2012-11-03 ENCOUNTER — Ambulatory Visit: Payer: Medicare Other | Admitting: Internal Medicine

## 2012-11-28 ENCOUNTER — Telehealth: Payer: Self-pay | Admitting: Internal Medicine

## 2012-11-28 DIAGNOSIS — J8410 Pulmonary fibrosis, unspecified: Secondary | ICD-10-CM

## 2012-11-28 NOTE — Telephone Encounter (Signed)
Fine with me

## 2012-11-28 NOTE — Telephone Encounter (Signed)
Please advised Dr. Sherene Sires if okay to refer to pulm rehab at [facility]? thanks

## 2012-11-28 NOTE — Telephone Encounter (Signed)
Order placed for rehab at [facility]. Carron Curie, CMA

## 2012-12-06 ENCOUNTER — Telehealth (HOSPITAL_COMMUNITY): Payer: Self-pay | Admitting: *Deleted

## 2012-12-12 ENCOUNTER — Encounter (HOSPITAL_COMMUNITY)
Admission: RE | Admit: 2012-12-12 | Discharge: 2012-12-12 | Disposition: A | Discharge status: Home / Self Care | Payer: Medicare Other | Source: Ambulatory Visit | Attending: Internal Medicine | Admitting: Internal Medicine

## 2012-12-12 DIAGNOSIS — J8410 Pulmonary fibrosis, unspecified: Secondary | ICD-10-CM | POA: Insufficient documentation

## 2012-12-12 DIAGNOSIS — Z5189 Encounter for other specified aftercare: Secondary | ICD-10-CM | POA: Insufficient documentation

## 2012-12-12 DIAGNOSIS — J438 Other emphysema: Secondary | ICD-10-CM | POA: Insufficient documentation

## 2012-12-12 NOTE — Progress Notes (Signed)
James Pope 79 y.o. male Pulmonary Rehab Orientation Note Patient arrived today in Cardiac and Pulmonary Rehab for orientation to Pulmonary Rehab with his wife and sister.  He was transported from [facility] via wheel chair by his wife.  He does carry portable oxygen, but was not using it and his saturations were 95% on room air.  Per pt, he uses oxygen frequently.  When he is sitting at home he does not use oxygen, but if he does any walking he does use it.  He also uses oxygen at night when he sleeps.   Color good, skin warm and dry. Patient is oriented to time and place. Patient's medical history and medications reviewed. Heart rate is normal, breath sounds clear to auscultation,with crackles bilaterally from bases to 1/3 up.  Grip strength equal, strong. Distal pulses 2+ posterior tibial bilaterally.  Patient reports he does take medications as prescribed. Patient states he follows a Regular. The patient reports no specific efforts to gain or lose weight.. Patient's weight will be monitored closely. Demonstration and practice of PLB using pulse oximeter. Patient able to return demonstration satisfactorily. Safety and hand hygiene in the exercise area reviewed with patient. Patient voices understanding of the information reviewed. Department expectations discussed with patient and achievable goals were set. The patient shows enthusiasm about attending the program and we look forward to working with this nice gentleman. The patient is scheduled to begin exerciseand to begin exercise on Tuesday, December 20, 2012 in the 1:30 pm class.   [id number] Haskel Khan RN

## 2012-12-20 ENCOUNTER — Encounter (HOSPITAL_COMMUNITY): Payer: Self-pay

## 2012-12-20 ENCOUNTER — Encounter (HOSPITAL_COMMUNITY)
Admission: RE | Admit: 2012-12-20 | Discharge: 2012-12-20 | Disposition: A | Discharge status: Home / Self Care | Payer: Medicare Other | Source: Ambulatory Visit | Attending: Internal Medicine | Admitting: Internal Medicine

## 2012-12-20 DIAGNOSIS — J438 Other emphysema: Secondary | ICD-10-CM | POA: Insufficient documentation

## 2012-12-20 DIAGNOSIS — Z5189 Encounter for other specified aftercare: Secondary | ICD-10-CM | POA: Insufficient documentation

## 2012-12-20 DIAGNOSIS — J8410 Pulmonary fibrosis, unspecified: Secondary | ICD-10-CM | POA: Insufficient documentation

## 2012-12-22 ENCOUNTER — Encounter (HOSPITAL_COMMUNITY)
Admission: RE | Admit: 2012-12-22 | Discharge: 2012-12-22 | Disposition: A | Discharge status: Home / Self Care | Payer: Medicare Other | Source: Ambulatory Visit | Attending: Internal Medicine | Admitting: Internal Medicine

## 2012-12-22 NOTE — Progress Notes (Signed)
James Pope completed a Six-Minute Walk Test on 12/20/12. James Pope walked 742 feet with 0 breaks.  The patient's lowest oxygen saturation was 89% , highest heart rate was 127 , and highest blood pressure was 110/72. The patient was on 2 liters. James Pope stated that leg fatigue hindered their walk test.

## 2012-12-27 ENCOUNTER — Encounter (HOSPITAL_COMMUNITY)
Admission: RE | Admit: 2012-12-27 | Discharge: 2012-12-27 | Disposition: A | Discharge status: Home / Self Care | Payer: Medicare Other | Source: Ambulatory Visit | Attending: Internal Medicine | Admitting: Internal Medicine

## 2012-12-27 ENCOUNTER — Encounter (HOSPITAL_COMMUNITY): Payer: Self-pay

## 2012-12-27 NOTE — Progress Notes (Signed)
I worked with [employer] on our staircase today in [facility].  I coached him on how to properly breath while going up the stairs.  I monitored his oxygen saturation and heart rate during this exercise.  The patient did not desaturate below 93% and his highest heart rate was 120.  The patient seemed to be working hard but enjoyed the challenge.

## 2012-12-29 ENCOUNTER — Encounter (HOSPITAL_COMMUNITY)
Admission: RE | Admit: 2012-12-29 | Discharge: 2012-12-29 | Disposition: A | Discharge status: Home / Self Care | Payer: Medicare Other | Source: Ambulatory Visit | Attending: Internal Medicine | Admitting: Internal Medicine

## 2012-12-29 NOTE — Progress Notes (Signed)
Patient is here for pulmonary rehab. Heart upon check in noted at 120. Patient placed on the Zoll. Telemetry rhythm Appears to be sinus tach 118.  Temperature 98.3.  Blood pressure 108/60. Oxygen saturation 94-98% on 2l/min. James Pope denies any increased shortness of breath.  Upon assessment lungs fields coarse with some scattered fine crackles noted in the bases greater on the left. Nada Boozer FNP-C for Dr [name] called and notified of tachycardia. Faxed Today's ECG tracings for Nada Boozer FNP to review.

## 2012-12-29 NOTE — Progress Notes (Signed)
Nada Boozer FNP-c reviewed Keyvin's ECG tracing and verified that Mr Osley is in Sinus tach.  Appointment made for Mr Harig to see Nada Boozer FNP-C tomorrow at 1000 am.   Mr Groleau exit oxygen saturation was 98% on 2 liters.  Exit heart rate 98. Mr Vallee did not exercise this afternoon at pulmonary rehab.

## 2012-12-30 ENCOUNTER — Encounter: Payer: Self-pay | Admitting: Cardiology

## 2012-12-30 ENCOUNTER — Ambulatory Visit (INDEPENDENT_AMBULATORY_CARE_PROVIDER_SITE_OTHER): Payer: Medicare Other | Admitting: Cardiology

## 2012-12-30 VITALS — BP 110/80 | HR 102 | Ht 69.0 in | Wt 194.5 lb

## 2012-12-30 DIAGNOSIS — R000 Tachycardia, unspecified: Secondary | ICD-10-CM

## 2012-12-30 DIAGNOSIS — I498 Other specified cardiac arrhythmias: Secondary | ICD-10-CM

## 2012-12-30 DIAGNOSIS — J9610 Chronic respiratory failure, unspecified whether with hypoxia or hypercapnia: Secondary | ICD-10-CM

## 2012-12-30 DIAGNOSIS — I10 Essential (primary) hypertension: Secondary | ICD-10-CM

## 2012-12-30 DIAGNOSIS — I2510 Atherosclerotic heart disease of native coronary artery without angina pectoris: Secondary | ICD-10-CM

## 2012-12-30 MED ORDER — DILTIAZEM HCL 30 MG PO TABS: 30.0000 mg | ORAL_TABLET | Freq: Two times a day (BID) | ORAL | Status: DC

## 2012-12-30 NOTE — Assessment & Plan Note (Signed)
No longer hypertensive, usually borderline hypotensive. Going slow with cardizem

## 2012-12-30 NOTE — Assessment & Plan Note (Addendum)
Followed by Pul. On chronic oxygen

## 2012-12-30 NOTE — Assessment & Plan Note (Signed)
Mild to moderate disease. 03/2010 80% stenosis in a small diag branch at the LAD; 50-60% narrowing to the 3rd marginal branch in the distal LCX-treated medically.  No chest pain.   Last nuc study 04/19/12, low risk study, normal EF.

## 2012-12-30 NOTE — Patient Instructions (Signed)
START taking diltiazem (Cardizem) 30mg  by mouth twice daily.  If you feel WEAK and/or DIZZY - take your blood pressure - if the top number (systolic) is under 100 HOLD DILTIAZEM.  Your physician recommends that you schedule a follow-up appointment in: 2 weeks with Vernona Rieger, NP

## 2012-12-30 NOTE — Progress Notes (Signed)
12/30/2012   PCP: Laurena Slimmer, MD   Chief Complaint  Patient presents with  . Tachycardia    yest at pulm rehab; SOB - wears 2L O2 continuously; atenolol 12.5mg  daily stopped by Dr. Sherene Sires 9/24    Primary Cardiologist: Dr. [name]  HPI:  79 year old with hx of HTN with LVH, aortic valve sclerosis and mild to moderate CAD>  Last cardiac cath in 03/2010 with 80% stenosis in a small diag. Branch of the LAD and 50-60% stenosis to the 3rd marginal branch of the distal LCX.  He is treated medically.  HX of COPD on home oxygen, and diffuse emphysematous changes and fibrosis in his lungs with coronary calcification.  In Sept his atenolol was stopped by Pulmonary.  He was titrated off to prevent re-bound tachycardia.  Since that time his HR on office visits has gradually climbed.  At Pulmonary rehab yesterday his HR was up to 118.  His BP was stable.  We were notified and pt is here today for followup.  His BP has been lower recently as well.    Here just walking in HR 104.  No chest pain, no increase in SOB.  No fevers or any other issues, he actually feels quite well.     Allergies  Allergen Reactions  . Other Other (See Comments)    Medication-NapraPAC (prevacid and naprosyn) Reaction-Hiccups  . Prednisone Other (See Comments)    Hiccups    Current Outpatient Prescriptions  Medication Sig Dispense Refill  . aspirin EC 81 MG tablet Take 81 mg by mouth every morning.       . calcium carbonate (TUMS - DOSED IN MG ELEMENTAL CALCIUM) 500 MG chewable tablet Per bottle as needed for heartburn      . Calcium Carbonate-Vitamin D (CALCIUM 600 + D PO) Take 1 tablet by mouth at bedtime.      . colchicine 0.6 MG tablet Take 0.6 mg by mouth at bedtime.       Marland Kitchen dextromethorphan-guaiFENesin (MUCINEX DM) 30-600 MG per 12 hr tablet Take 1 tablet by mouth every 12 (twelve) hours.      . famotidine (PEPCID) 20 MG tablet Take 20 mg by mouth at bedtime. One at bedtime      . furosemide  (LASIX) 20 MG tablet Take 20 mg by mouth daily.      . Homeopathic Products ([employer] LEG CRAMPS PAIN RELIEF PO) Take 1 tablet by mouth daily as needed.      . methylPREDNISolone (MEDROL) 16 MG tablet Take 8 mg by mouth every morning.       . Multiple Vitamins-Minerals (MULTIVITAMINS THER. W/MINERALS) TABS Take 1 tablet by mouth every morning.       . OXYGEN-HELIUM IN Inhale 2 L into the lungs continuous.      . pantoprazole (PROTONIX) 40 MG tablet TAKE ONE TABLET BY MOUTH ONE TIME DAILY 30 TO 60 MINUTES BEFORE FIRST MEAL OF THE DAY  30 tablet  5  . simethicone (MYLICON) 125 MG chewable tablet Chew by mouth. Per bottle as needed for gas/bloating      . Tamsulosin HCl (FLOMAX) 0.4 MG CAPS Take 0.4 mg by mouth at bedtime.       Marland Kitchen Umeclidinium-Vilanterol (ANORO ELLIPTA) 62.5-25 MCG/INH AEPB Inhale 1 puff into the lungs daily.  7 each  0  . diltiazem (CARDIZEM) 30 MG tablet Take 1 tablet (30 mg total) by mouth 2 (two) times daily.  60 tablet  1   No current facility-administered medications for this visit.    Past Medical History  Diagnosis Date  . Hypertension   . Gout   . Hyperlipidemia   . Prostate cancer   . CAD (coronary artery disease)   . COPD (chronic obstructive pulmonary disease)   . Anxiety and depression   . Hiatal hernia   . Pulmonary fibrosis 12/23/2011    Followed in Pulmonary clinic/ [facility]/ [facility]  - First suggested on cxr 12/2004 but clinically limiting activity since 2012 - PFT's 11/24/11  VC 2.94 and dllc 29%, FEV1 80%, ratio 74, no change w/ BD  - 12/23/2011  Walked RA x 2 laps @ 185 ft each stopped due to  desat to 86% -01/14/2012  Walked RA  2 laps @ 185 ft each stopped due to  desat to 87%  - ESR 72 12/23/11  > started steroids 1/23  . CKD (chronic kidney disease) 08/07/2011  . History of colon polyps   . Candida esophagitis     Past Surgical History  Procedure Laterality Date  . Cardiac catheterization    . Skin graft      rt arm  . Colonoscopy      x 2- Dr.  Randa Evens    ZOX:[id number] colds or fevers, no weight changes Skin:no rashes or ulcers HEENT:no blurred vision, no congestion CV:see HPI PUL:see HPI GI:no diarrhea constipation or melena, no indigestion GU:no hematuria, no dysuria MS:no joint pain, no claudication Neuro:no syncope, no lightheadedness Endo:no diabetes, no thyroid disease  PHYSICAL EXAM BP 110/80  Pulse 102  Ht 5\' 9"  (1.753 m)  Wt 194 lb 8 oz (88.225 kg)  BMI 28.71 kg/m2 General:Pleasant affect, NAD Skin:Warm and dry, brisk capillary refill HEENT:normocephalic, sclera clear, mucus membranes moist Neck:supple, no JVD, no bruits  Heart:S1S2 RRR without murmur, gallup, rub or click Lungs: without rales, + rhonchi and occ. wheezes WJX:BJYN, non tender, + BS, do not palpate liver spleen or masses Ext:no lower ext edema, 2+ pedal pulses, 2+ radial pulses Neuro:alert and oriented, MAE, follows commands, + facial symmetry  WGN:FAOZH tach 102, no acute changes   ASSESSMENT AND PLAN Sinus tachycardia Yesterday at pul rehab, HR was up to 118.  His atenolol was stopped in Sept per pulmonary, he was weaned down and stopped.  Over the last several appointments pt's HR has increased from 75 on atenolol to 87, in oct 99 and now up to 118 walking in the building.  Will begin cardizem 30 mg twice a day to prevent tachycardia but should not adversely affect BP, whi ch has been low at times.   I will see him back in 2 weeks for follow up.  If his BP is less than 100 systolic and he is dizzy he will hold the cardizem.  CAD (coronary artery disease) Mild to moderate disease. 03/2010 80% stenosis in a small diag branch at the LAD; 50-60% narrowing to the 3rd marginal branch in the distal LCX-treated medically.  No chest pain.   Last nuc study 04/19/12, low risk study, normal EF.  Chronic respiratory failure Followed by Pul. On chronic oxygen  HTN (hypertension) No longer hypertensive, usually borderline hypotensive. Going slow with  cardizem

## 2012-12-30 NOTE — Assessment & Plan Note (Signed)
Yesterday at pul rehab, HR was up to 118.  His atenolol was stopped in Sept per pulmonary, he was weaned down and stopped.  Over the last several appointments pt's HR has increased from 75 on atenolol to 87, in oct 99 and now up to 118 walking in the building.  Will begin cardizem 30 mg twice a day to prevent tachycardia but should not adversely affect BP, whi ch has been low at times.   I will see him back in 2 weeks for follow up.  If his BP is less than 100 systolic and he is dizzy he will hold the cardizem.

## 2013-01-03 ENCOUNTER — Encounter (HOSPITAL_COMMUNITY)
Admission: RE | Admit: 2013-01-03 | Discharge: 2013-01-03 | Disposition: A | Discharge status: Home / Self Care | Payer: Medicare Other | Source: Ambulatory Visit | Attending: Internal Medicine | Admitting: Internal Medicine

## 2013-01-05 ENCOUNTER — Encounter (HOSPITAL_COMMUNITY)
Admission: RE | Admit: 2013-01-05 | Discharge: 2013-01-05 | Disposition: A | Discharge status: Home / Self Care | Payer: Medicare Other | Source: Ambulatory Visit | Attending: Internal Medicine | Admitting: Internal Medicine

## 2013-01-09 ENCOUNTER — Ambulatory Visit (INDEPENDENT_AMBULATORY_CARE_PROVIDER_SITE_OTHER): Payer: Medicare Other | Admitting: Cardiology

## 2013-01-09 ENCOUNTER — Encounter: Payer: Self-pay | Admitting: Cardiology

## 2013-01-09 VITALS — BP 120/78 | HR 88 | Ht 69.0 in | Wt 190.9 lb

## 2013-01-09 DIAGNOSIS — I2510 Atherosclerotic heart disease of native coronary artery without angina pectoris: Secondary | ICD-10-CM

## 2013-01-09 DIAGNOSIS — I10 Essential (primary) hypertension: Secondary | ICD-10-CM

## 2013-01-09 DIAGNOSIS — I498 Other specified cardiac arrhythmias: Secondary | ICD-10-CM

## 2013-01-09 DIAGNOSIS — R000 Tachycardia, unspecified: Secondary | ICD-10-CM

## 2013-01-09 MED ORDER — DILTIAZEM HCL 30 MG PO TABS: 30.0000 mg | ORAL_TABLET | Freq: Two times a day (BID) | ORAL | Status: DC

## 2013-01-09 NOTE — Assessment & Plan Note (Signed)
HR at 88 today, HR better with rehab.  Would not increase cardizem due to dizziness at times.  He feels good today.  I refilled the cardizem.

## 2013-01-09 NOTE — Progress Notes (Signed)
01/09/2013   PCP: Laurena Slimmer, MD   Chief Complaint  Patient presents with  . Follow-up    2 week visit; SOB (not new or worse); lightheaded after medications    Primary Cardiologist: Dr. [name]  HPI:  79 year old with hx of HTN with LVH, aortic valve sclerosis and mild to moderate CAD> Last cardiac cath in 03/2010 with 80% stenosis in a small diag. Branch of the LAD and 50-60% stenosis to the 3rd marginal branch of the distal LCX. He is treated medically. HX of COPD on home oxygen, and diffuse emphysematous changes and fibrosis in his lungs with coronary calcification. In Sept his atenolol was stopped by Pulmonary. He was titrated off to prevent re-bound tachycardia. Since that time his HR on office visits has gradually climbed. At Pulmonary rehab yesterday his HR was up to 118. His BP was stable. We were notified and pt is here today for followup. His BP has been lower recently as well.   I added cardizem 30 mg BID and he is back today for follow up.  HR controlled at 80.  No chest pain, no change in his normal SOB.  Oxygen is in place.  Pulmonary rehab has noted improved HR also.  He does have some dizziness if he gets up too fast.  He also states his BP at home after the meds does go as low as 100 systolic.     Allergies  Allergen Reactions  . Other Other (See Comments)    Medication-NapraPAC (prevacid and naprosyn) Reaction-Hiccups  . Prednisone Other (See Comments)    Hiccups    Current Outpatient Prescriptions  Medication Sig Dispense Refill  . aspirin EC 81 MG tablet Take 81 mg by mouth every morning.       . calcium carbonate (TUMS - DOSED IN MG ELEMENTAL CALCIUM) 500 MG chewable tablet Per bottle as needed for heartburn      . Calcium Carbonate-Vitamin D (CALCIUM 600 + D PO) Take 1 tablet by mouth at bedtime.      . colchicine 0.6 MG tablet Take 0.6 mg by mouth at bedtime.       Marland Kitchen dextromethorphan-guaiFENesin (MUCINEX DM) 30-600 MG per 12 hr tablet Take  1 tablet by mouth every 12 (twelve) hours.      Marland Kitchen diltiazem (CARDIZEM) 30 MG tablet Take 1 tablet (30 mg total) by mouth 2 (two) times daily.  60 tablet  1  . famotidine (PEPCID) 20 MG tablet Take 20 mg by mouth at bedtime. One at bedtime      . furosemide (LASIX) 20 MG tablet Take 20 mg by mouth daily.      . Homeopathic Products ([employer]S LEG CRAMPS PAIN RELIEF PO) Take 1 tablet by mouth daily as needed.      . methylPREDNISolone (MEDROL) 16 MG tablet Take 8 mg by mouth every morning.       . Multiple Vitamins-Minerals (MULTIVITAMINS THER. W/MINERALS) TABS Take 1 tablet by mouth every morning.       . OXYGEN-HELIUM IN Inhale 2 L into the lungs continuous.      . pantoprazole (PROTONIX) 40 MG tablet TAKE ONE TABLET BY MOUTH ONE TIME DAILY 30 TO 60 MINUTES BEFORE FIRST MEAL OF THE DAY  30 tablet  5  . simethicone (MYLICON) 125 MG chewable tablet Chew by mouth. Per bottle as needed for gas/bloating      . Tamsulosin HCl (FLOMAX) 0.4 MG CAPS Take 0.4  mg by mouth at bedtime.       Marland Kitchen Umeclidinium-Vilanterol (ANORO ELLIPTA) 62.5-25 MCG/INH AEPB Inhale 1 puff into the lungs daily.  7 each  0   No current facility-administered medications for this visit.    Past Medical History  Diagnosis Date  . Hypertension   . Gout   . Hyperlipidemia   . Prostate cancer   . CAD (coronary artery disease)   . COPD (chronic obstructive pulmonary disease)   . Anxiety and depression   . Hiatal hernia   . Pulmonary fibrosis 12/23/2011    Followed in Pulmonary clinic/ [facility]/ [facility]  - First suggested on cxr 12/2004 but clinically limiting activity since 2012 - PFT's 11/24/11  VC 2.94 and dllc 29%, FEV1 80%, ratio 74, no change w/ BD  - 12/23/2011  Walked RA x 2 laps @ 185 ft each stopped due to  desat to 86% -01/14/2012  Walked RA  2 laps @ 185 ft each stopped due to  desat to 87%  - ESR 72 12/23/11  > started steroids 1/23  . CKD (chronic kidney disease) 08/07/2011  . History of colon polyps   . Candida  esophagitis     Past Surgical History  Procedure Laterality Date  . Cardiac catheterization    . Skin graft      rt arm  . Colonoscopy      x 2- Dr. Randa Evens    UXL:[id number] colds or fevers, weight is down 4 pounds Skin:no rashes or ulcers HEENT:no blurred vision, no congestion CV:see HPI PUL:see HPI GI:no diarrhea constipation or melena, no indigestion GU:no hematuria, no dysuria MS:no joint pain, no claudication Neuro:no syncope, no lightheadedness Endo:no diabetes, no thyroid disease  PHYSICAL EXAM BP 120/78  Pulse 88  Ht 5\' 9"  (1.753 m)  Wt 190 lb 14.4 oz (86.592 kg)  BMI 28.18 kg/m2 General:Pleasant affect, NAD Skin:Warm and dry, brisk capillary refill HEENT:normocephalic, sclera clear, mucus membranes moist Neck:supple, no JVD, no bruits  Heart:S1S2 RRR without murmur, gallup, rub or click Lungs:clear without rales, rhonchi, or wheezes ZDG:UYQI, non tender, + BS, do not palpate liver spleen or masses Ext:no lower ext edema, 2+ pedal pulses, 2+ radial pulses Neuro:alert and oriented, MAE, follows commands, + facial symmetry   ASSESSMENT AND PLAN Sinus tachycardia HR at 88 today, HR better with rehab.  Would not increase cardizem due to dizziness at times.  He feels good today.  I refilled the cardizem.  HTN (hypertension) BP stable and not low.  CAD (coronary artery disease) No chest pain.

## 2013-01-09 NOTE — Assessment & Plan Note (Signed)
BP stable and not low.

## 2013-01-09 NOTE — Assessment & Plan Note (Signed)
No chest pain

## 2013-01-09 NOTE — Patient Instructions (Signed)
Your physician wants you to follow-up in: 3 months with Dr. [name]. You will receive a reminder letter in the mail two months in advance. If you don't receive a letter, please call our office to schedule the follow-up appointment.  Please continue your current medications.

## 2013-01-10 ENCOUNTER — Encounter: Payer: Self-pay | Admitting: Cardiovascular Disease

## 2013-01-10 ENCOUNTER — Encounter (HOSPITAL_COMMUNITY)
Admission: RE | Admit: 2013-01-10 | Discharge: 2013-01-10 | Disposition: A | Discharge status: Home / Self Care | Payer: Medicare Other | Source: Ambulatory Visit | Attending: Internal Medicine | Admitting: Internal Medicine

## 2013-01-10 NOTE — Progress Notes (Signed)
James Pope 79 y.o. male Nutrition Note Spoke with pt and pt's wife. Pt is overweight. Pt has been trying to lose wt by decreasing portion sizes consumed. Pt wt has been trending downward. According to the EMR,pt has lost 4.3 lb over the past 6 months. Pt is taking prednisone, which pt states has made wt loss challenging. Pt taking TUMS daily and consumes 1 or less servings of dairy products daily. Pt encouraged to increase calcium to 600 mg BID. Pt reportedly taking vitamin D supplement daily. Per nutrition screen, pt has difficulty chewing/swallowing. Pt states he has difficulty swallowing due to what sounds like an esophageal stricture. Pt reports he "had my esophagus stretched." Pt educated re: methods to help to ease eating (e.g. Drinking warm water before meals and the chin tuck technique) There are some areas for improvement in pt diet. Pt's wife reports eating out varies due to pt's wife is the primary caregiver for her mother, her Uncle, and her step-mother. Ways to make healthier choices when eating out discussed. Pt's Rate Your Plate results reviewed with pt. Pt avoids some salty food. Pt states his wife "watches the sodium content of foods." Pt eats a frozen dinner once a week. Pt adds salt to food.  The role of sodium in lung disease reviewed with pt. Pt expressed understanding of the information reviewed. Nutrition Diagnosis   Excessive sodium intake related to over consumption of processed food as evidenced by frequent consumption of convenience food/ canned vegetables and eating out frequently.   Food-and nutrition-related knowledge deficit related to lack of exposure to information as related to diagnosis of pulmonary disease   Overweight related to excessive energy intake as evidenced by a BMI of 28.2  Nutrition Rx/Est. Daily Nutrition Needs for: ? wt loss 1400-1900 Kcal  105-130 gm protein   1500 mg or less sodium      Nutrition Intervention   Pt's individual nutrition plan and  goals reviewed with pt.   Benefits of adopting healthy eating habits discussed when pt's Rate Your Plate reviewed.   Pt to attend the Nutrition and Lung Disease class   Handouts given for: Nutrition II class   Continual client-centered nutrition education by RD, as part of interdisciplinary care. Goal(s) 1. Pt to identify and limit food sources of sodium. 2. Identify food quantities necessary to achieve wt loss of  -2# per week to a goal wt of 76.5-84.7 kg (168-186 lb) at graduation from pulmonary rehab. 3. Describe the benefit of including fruits, vegetables, whole grains, and low-fat dairy products in a healthy meal plan. Monitor and Evaluate progress toward nutrition goal with team.   Mickle Plumb, M.Ed, RD, LDN, CDE 01/10/2013 2:41 PM

## 2013-01-12 ENCOUNTER — Encounter (HOSPITAL_COMMUNITY): Payer: Medicare Other

## 2013-01-17 ENCOUNTER — Encounter (HOSPITAL_COMMUNITY)
Admission: RE | Admit: 2013-01-17 | Discharge: 2013-01-17 | Disposition: A | Discharge status: Home / Self Care | Payer: Medicare Other | Source: Ambulatory Visit | Attending: Internal Medicine | Admitting: Internal Medicine

## 2013-01-17 ENCOUNTER — Encounter (HOSPITAL_COMMUNITY): Payer: Self-pay

## 2013-01-17 NOTE — Progress Notes (Signed)
I have reviewed a Home Exercise Prescription with the [facility] . James Pope is not currently exercising at home.  The patient was advised to walk 2 days a week for 20 minutes.  James Pope and I discussed how to progress their exercise prescription.  The patient stated that their goals were to breathe better and feel better.  The patient stated that they understand the exercise prescription.  We reviewed exercise guidelines, target heart rate during exercise, oxygen use, weather, home pulse oximeter, endpoints for exercise, and goals.  Patient is encouraged to come to me with any questions. I will continue to follow up with the patient to assist them with progression and safety.

## 2013-01-19 ENCOUNTER — Encounter (HOSPITAL_COMMUNITY): Payer: Medicare Other

## 2013-01-19 DIAGNOSIS — Z5189 Encounter for other specified aftercare: Secondary | ICD-10-CM | POA: Insufficient documentation

## 2013-01-19 DIAGNOSIS — J8410 Pulmonary fibrosis, unspecified: Secondary | ICD-10-CM | POA: Insufficient documentation

## 2013-01-19 DIAGNOSIS — J438 Other emphysema: Secondary | ICD-10-CM | POA: Insufficient documentation

## 2013-01-20 ENCOUNTER — Telehealth: Payer: Self-pay | Admitting: Internal Medicine

## 2013-01-20 DIAGNOSIS — J9610 Chronic respiratory failure, unspecified whether with hypoxia or hypercapnia: Secondary | ICD-10-CM

## 2013-01-20 NOTE — Telephone Encounter (Signed)
lmomtcb for Mindy with [employer]

## 2013-01-24 ENCOUNTER — Encounter (HOSPITAL_COMMUNITY)
Admission: RE | Admit: 2013-01-24 | Discharge: 2013-01-24 | Disposition: A | Discharge status: Home / Self Care | Payer: Medicare Other | Source: Ambulatory Visit | Attending: Internal Medicine | Admitting: Internal Medicine

## 2013-01-24 NOTE — Telephone Encounter (Signed)
lmomtcb x2 [employer]

## 2013-01-25 ENCOUNTER — Telehealth: Payer: Self-pay | Admitting: Internal Medicine

## 2013-01-25 NOTE — Telephone Encounter (Signed)
See phone note 01/20/13. Duplicate message

## 2013-01-25 NOTE — Telephone Encounter (Signed)
Order placed. Called and made mandy aware. Nothing further needed

## 2013-01-25 NOTE — Telephone Encounter (Signed)
Ok with me 

## 2013-01-25 NOTE — Telephone Encounter (Signed)
Called spoke with [facility] who reported that pt is requesting smaller portable O2 tanks.  Leafy Ro is requesting an order for an "OCD titration" >> Oxygen Conserving Device Titration to evaluate pt for portable concentrator.  Dr Melvyn Novas please advise if okay to place order, thanks.

## 2013-01-26 ENCOUNTER — Encounter (HOSPITAL_COMMUNITY)
Admission: RE | Admit: 2013-01-26 | Discharge: 2013-01-26 | Disposition: A | Discharge status: Home / Self Care | Payer: Medicare Other | Source: Ambulatory Visit | Attending: Internal Medicine | Admitting: Internal Medicine

## 2013-01-31 ENCOUNTER — Encounter (HOSPITAL_COMMUNITY)
Admission: RE | Admit: 2013-01-31 | Discharge: 2013-01-31 | Disposition: A | Discharge status: Home / Self Care | Payer: Medicare Other | Source: Ambulatory Visit | Attending: Internal Medicine | Admitting: Internal Medicine

## 2013-02-02 ENCOUNTER — Encounter (HOSPITAL_COMMUNITY)
Admission: RE | Admit: 2013-02-02 | Discharge: 2013-02-02 | Disposition: A | Discharge status: Home / Self Care | Payer: Medicare Other | Source: Ambulatory Visit | Attending: Internal Medicine | Admitting: Internal Medicine

## 2013-02-07 ENCOUNTER — Encounter (HOSPITAL_COMMUNITY)
Admission: RE | Admit: 2013-02-07 | Discharge: 2013-02-07 | Disposition: A | Discharge status: Home / Self Care | Payer: Medicare Other | Source: Ambulatory Visit | Attending: Internal Medicine | Admitting: Internal Medicine

## 2013-02-09 ENCOUNTER — Encounter (HOSPITAL_COMMUNITY)
Admission: RE | Admit: 2013-02-09 | Discharge: 2013-02-09 | Disposition: A | Discharge status: Home / Self Care | Payer: Medicare Other | Source: Ambulatory Visit | Attending: Internal Medicine | Admitting: Internal Medicine

## 2013-02-14 ENCOUNTER — Encounter (HOSPITAL_COMMUNITY)
Admission: RE | Admit: 2013-02-14 | Discharge: 2013-02-14 | Disposition: A | Discharge status: Home / Self Care | Payer: Medicare Other | Source: Ambulatory Visit | Attending: Internal Medicine | Admitting: Internal Medicine

## 2013-02-16 ENCOUNTER — Encounter (HOSPITAL_COMMUNITY)
Admission: RE | Admit: 2013-02-16 | Discharge: 2013-02-16 | Disposition: A | Discharge status: Home / Self Care | Payer: Medicare Other | Source: Ambulatory Visit | Attending: Internal Medicine | Admitting: Internal Medicine

## 2013-02-21 ENCOUNTER — Encounter (HOSPITAL_COMMUNITY)
Admission: RE | Admit: 2013-02-21 | Discharge: 2013-02-21 | Disposition: A | Discharge status: Home / Self Care | Payer: Medicare Other | Source: Ambulatory Visit | Attending: Internal Medicine | Admitting: Internal Medicine

## 2013-02-21 DIAGNOSIS — J438 Other emphysema: Secondary | ICD-10-CM | POA: Insufficient documentation

## 2013-02-21 DIAGNOSIS — Z5189 Encounter for other specified aftercare: Secondary | ICD-10-CM | POA: Insufficient documentation

## 2013-02-21 DIAGNOSIS — J8410 Pulmonary fibrosis, unspecified: Secondary | ICD-10-CM | POA: Insufficient documentation

## 2013-02-23 ENCOUNTER — Telehealth (HOSPITAL_COMMUNITY): Payer: Self-pay | Admitting: Internal Medicine

## 2013-02-23 ENCOUNTER — Encounter (HOSPITAL_COMMUNITY): Payer: Medicare Other

## 2013-02-25 ENCOUNTER — Other Ambulatory Visit: Payer: Self-pay | Admitting: Cardiology

## 2013-02-27 NOTE — Telephone Encounter (Signed)
Rx was sent to pharmacy electronically. 

## 2013-02-28 ENCOUNTER — Encounter (HOSPITAL_COMMUNITY)
Admission: RE | Admit: 2013-02-28 | Discharge: 2013-02-28 | Disposition: A | Discharge status: Home / Self Care | Payer: Medicare Other | Source: Ambulatory Visit | Attending: Internal Medicine | Admitting: Internal Medicine

## 2013-03-02 ENCOUNTER — Encounter (HOSPITAL_COMMUNITY)
Admission: RE | Admit: 2013-03-02 | Discharge: 2013-03-02 | Disposition: A | Discharge status: Home / Self Care | Payer: Medicare Other | Source: Ambulatory Visit | Attending: Internal Medicine | Admitting: Internal Medicine

## 2013-03-07 ENCOUNTER — Encounter (HOSPITAL_COMMUNITY): Payer: Medicare Other

## 2013-03-09 ENCOUNTER — Encounter (HOSPITAL_COMMUNITY): Payer: Medicare Other

## 2013-03-14 ENCOUNTER — Encounter (HOSPITAL_COMMUNITY): Payer: Medicare Other

## 2013-03-16 ENCOUNTER — Encounter (HOSPITAL_COMMUNITY): Payer: Medicare Other

## 2013-03-21 ENCOUNTER — Encounter (HOSPITAL_COMMUNITY): Payer: Medicare Other

## 2013-03-21 NOTE — Progress Notes (Signed)
Patient arrived to pulmonary rehab today more short of breath and oxygen levels were lower than normal.  He was started on a second round of antibiotics yesterday.  Walking in from the parking lot his oxygen saturations were 79% on 3L and took 3-5 minutes to return to only 90-91%.  Patient was very tired after this.  We decided patient should not exercise today and was sent home.  Instructed to return when saturations have improved and feeling better.

## 2013-03-22 ENCOUNTER — Inpatient Hospital Stay (HOSPITAL_COMMUNITY)
Admission: EM | Admit: 2013-03-22 | Discharge: 2013-04-19 | DRG: 871 | Disposition: E | Payer: Medicare Other | Attending: Internal Medicine | Admitting: Internal Medicine

## 2013-03-22 ENCOUNTER — Encounter (HOSPITAL_COMMUNITY): Payer: Self-pay | Admitting: Emergency Medicine

## 2013-03-22 ENCOUNTER — Emergency Department (HOSPITAL_COMMUNITY): Payer: Medicare Other

## 2013-03-22 DIAGNOSIS — N400 Enlarged prostate without lower urinary tract symptoms: Secondary | ICD-10-CM | POA: Diagnosis present

## 2013-03-22 DIAGNOSIS — B3789 Other sites of candidiasis: Secondary | ICD-10-CM | POA: Diagnosis present

## 2013-03-22 DIAGNOSIS — J9620 Acute and chronic respiratory failure, unspecified whether with hypoxia or hypercapnia: Secondary | ICD-10-CM

## 2013-03-22 DIAGNOSIS — T502X5A Adverse effect of carbonic-anhydrase inhibitors, benzothiadiazides and other diuretics, initial encounter: Secondary | ICD-10-CM | POA: Diagnosis present

## 2013-03-22 DIAGNOSIS — Z860100 Personal history of colon polyps, unspecified: Secondary | ICD-10-CM

## 2013-03-22 DIAGNOSIS — E860 Dehydration: Secondary | ICD-10-CM | POA: Diagnosis present

## 2013-03-22 DIAGNOSIS — B370 Candidal stomatitis: Secondary | ICD-10-CM | POA: Diagnosis present

## 2013-03-22 DIAGNOSIS — R339 Retention of urine, unspecified: Secondary | ICD-10-CM | POA: Diagnosis not present

## 2013-03-22 DIAGNOSIS — J9610 Chronic respiratory failure, unspecified whether with hypoxia or hypercapnia: Secondary | ICD-10-CM

## 2013-03-22 DIAGNOSIS — J189 Pneumonia, unspecified organism: Secondary | ICD-10-CM | POA: Diagnosis present

## 2013-03-22 DIAGNOSIS — Z87891 Personal history of nicotine dependence: Secondary | ICD-10-CM

## 2013-03-22 DIAGNOSIS — Z8042 Family history of malignant neoplasm of prostate: Secondary | ICD-10-CM

## 2013-03-22 DIAGNOSIS — K449 Diaphragmatic hernia without obstruction or gangrene: Secondary | ICD-10-CM | POA: Diagnosis present

## 2013-03-22 DIAGNOSIS — T380X5A Adverse effect of glucocorticoids and synthetic analogues, initial encounter: Secondary | ICD-10-CM | POA: Diagnosis present

## 2013-03-22 DIAGNOSIS — J438 Other emphysema: Secondary | ICD-10-CM

## 2013-03-22 DIAGNOSIS — F411 Generalized anxiety disorder: Secondary | ICD-10-CM | POA: Diagnosis present

## 2013-03-22 DIAGNOSIS — Z8546 Personal history of malignant neoplasm of prostate: Secondary | ICD-10-CM

## 2013-03-22 DIAGNOSIS — E861 Hypovolemia: Secondary | ICD-10-CM | POA: Diagnosis not present

## 2013-03-22 DIAGNOSIS — W19XXXA Unspecified fall, initial encounter: Secondary | ICD-10-CM | POA: Diagnosis present

## 2013-03-22 DIAGNOSIS — G9340 Encephalopathy, unspecified: Secondary | ICD-10-CM | POA: Diagnosis not present

## 2013-03-22 DIAGNOSIS — D631 Anemia in chronic kidney disease: Secondary | ICD-10-CM | POA: Diagnosis present

## 2013-03-22 DIAGNOSIS — R5383 Other fatigue: Secondary | ICD-10-CM

## 2013-03-22 DIAGNOSIS — R5381 Other malaise: Secondary | ICD-10-CM | POA: Diagnosis present

## 2013-03-22 DIAGNOSIS — D638 Anemia in other chronic diseases classified elsewhere: Secondary | ICD-10-CM | POA: Diagnosis present

## 2013-03-22 DIAGNOSIS — I5189 Other ill-defined heart diseases: Secondary | ICD-10-CM | POA: Diagnosis present

## 2013-03-22 DIAGNOSIS — R7309 Other abnormal glucose: Secondary | ICD-10-CM | POA: Diagnosis present

## 2013-03-22 DIAGNOSIS — N179 Acute kidney failure, unspecified: Secondary | ICD-10-CM | POA: Diagnosis present

## 2013-03-22 DIAGNOSIS — I5031 Acute diastolic (congestive) heart failure: Secondary | ICD-10-CM | POA: Diagnosis present

## 2013-03-22 DIAGNOSIS — F329 Major depressive disorder, single episode, unspecified: Secondary | ICD-10-CM | POA: Diagnosis present

## 2013-03-22 DIAGNOSIS — R000 Tachycardia, unspecified: Secondary | ICD-10-CM | POA: Diagnosis not present

## 2013-03-22 DIAGNOSIS — Z9981 Dependence on supplemental oxygen: Secondary | ICD-10-CM

## 2013-03-22 DIAGNOSIS — I959 Hypotension, unspecified: Secondary | ICD-10-CM | POA: Diagnosis not present

## 2013-03-22 DIAGNOSIS — Z79899 Other long term (current) drug therapy: Secondary | ICD-10-CM

## 2013-03-22 DIAGNOSIS — N390 Urinary tract infection, site not specified: Secondary | ICD-10-CM | POA: Diagnosis present

## 2013-03-22 DIAGNOSIS — N183 Chronic kidney disease, stage 3 unspecified: Secondary | ICD-10-CM | POA: Diagnosis present

## 2013-03-22 DIAGNOSIS — F3289 Other specified depressive episodes: Secondary | ICD-10-CM | POA: Diagnosis present

## 2013-03-22 DIAGNOSIS — Z7982 Long term (current) use of aspirin: Secondary | ICD-10-CM

## 2013-03-22 DIAGNOSIS — Z8249 Family history of ischemic heart disease and other diseases of the circulatory system: Secondary | ICD-10-CM

## 2013-03-22 DIAGNOSIS — N039 Chronic nephritic syndrome with unspecified morphologic changes: Secondary | ICD-10-CM

## 2013-03-22 DIAGNOSIS — E876 Hypokalemia: Secondary | ICD-10-CM | POA: Diagnosis present

## 2013-03-22 DIAGNOSIS — I2510 Atherosclerotic heart disease of native coronary artery without angina pectoris: Secondary | ICD-10-CM

## 2013-03-22 DIAGNOSIS — J9600 Acute respiratory failure, unspecified whether with hypoxia or hypercapnia: Secondary | ICD-10-CM

## 2013-03-22 DIAGNOSIS — I10 Essential (primary) hypertension: Secondary | ICD-10-CM | POA: Diagnosis present

## 2013-03-22 DIAGNOSIS — J8409 Other alveolar and parieto-alveolar conditions: Secondary | ICD-10-CM | POA: Diagnosis present

## 2013-03-22 DIAGNOSIS — J441 Chronic obstructive pulmonary disease with (acute) exacerbation: Secondary | ICD-10-CM | POA: Diagnosis present

## 2013-03-22 DIAGNOSIS — J8410 Pulmonary fibrosis, unspecified: Secondary | ICD-10-CM | POA: Diagnosis present

## 2013-03-22 DIAGNOSIS — Z515 Encounter for palliative care: Secondary | ICD-10-CM

## 2013-03-22 DIAGNOSIS — M109 Gout, unspecified: Secondary | ICD-10-CM | POA: Diagnosis present

## 2013-03-22 DIAGNOSIS — N189 Chronic kidney disease, unspecified: Secondary | ICD-10-CM

## 2013-03-22 DIAGNOSIS — I509 Heart failure, unspecified: Secondary | ICD-10-CM | POA: Diagnosis present

## 2013-03-22 DIAGNOSIS — R531 Weakness: Secondary | ICD-10-CM | POA: Diagnosis present

## 2013-03-22 DIAGNOSIS — D696 Thrombocytopenia, unspecified: Secondary | ICD-10-CM | POA: Diagnosis present

## 2013-03-22 DIAGNOSIS — Z66 Do not resuscitate: Secondary | ICD-10-CM | POA: Diagnosis not present

## 2013-03-22 DIAGNOSIS — J449 Chronic obstructive pulmonary disease, unspecified: Secondary | ICD-10-CM

## 2013-03-22 DIAGNOSIS — J84112 Idiopathic pulmonary fibrosis: Secondary | ICD-10-CM | POA: Diagnosis present

## 2013-03-22 DIAGNOSIS — Z888 Allergy status to other drugs, medicaments and biological substances status: Secondary | ICD-10-CM

## 2013-03-22 DIAGNOSIS — I129 Hypertensive chronic kidney disease with stage 1 through stage 4 chronic kidney disease, or unspecified chronic kidney disease: Secondary | ICD-10-CM | POA: Diagnosis present

## 2013-03-22 DIAGNOSIS — R55 Syncope and collapse: Secondary | ICD-10-CM

## 2013-03-22 DIAGNOSIS — E785 Hyperlipidemia, unspecified: Secondary | ICD-10-CM | POA: Diagnosis present

## 2013-03-22 DIAGNOSIS — R609 Edema, unspecified: Secondary | ICD-10-CM

## 2013-03-22 DIAGNOSIS — A419 Sepsis, unspecified organism: Principal | ICD-10-CM | POA: Diagnosis present

## 2013-03-22 LAB — CBC
HEMATOCRIT: 37.1 % — AB (ref 39.0–52.0)
HEMOGLOBIN: 12.6 g/dL — AB (ref 13.0–17.0)
MCH: 31.7 pg (ref 26.0–34.0)
MCHC: 34 g/dL (ref 30.0–36.0)
MCV: 93.5 fL (ref 78.0–100.0)
Platelets: 188 10*3/uL (ref 150–400)
RBC: 3.97 MIL/uL — AB (ref 4.22–5.81)
RDW: 14.3 % (ref 11.5–15.5)
WBC: 10.1 10*3/uL (ref 4.0–10.5)

## 2013-03-22 LAB — BASIC METABOLIC PANEL
BUN: 17 mg/dL (ref 6–23)
CALCIUM: 9.7 mg/dL (ref 8.4–10.5)
CHLORIDE: 100 meq/L (ref 96–112)
CO2: 23 meq/L (ref 19–32)
Creatinine, Ser: 1.42 mg/dL — ABNORMAL HIGH (ref 0.50–1.35)
GFR calc Af Amer: 53 mL/min — ABNORMAL LOW (ref >90)
GFR calc non Af Amer: 46 mL/min — ABNORMAL LOW (ref >90)
GLUCOSE: 142 mg/dL — AB (ref 70–99)
POTASSIUM: 3.6 meq/L — AB (ref 3.7–5.3)
SODIUM: 142 meq/L (ref 137–147)

## 2013-03-22 LAB — PRO B NATRIURETIC PEPTIDE: PRO B NATRI PEPTIDE: 134.2 pg/mL (ref 0–450)

## 2013-03-22 LAB — I-STAT TROPONIN, ED: Troponin i, poc: 0.02 ng/mL (ref 0.00–0.08)

## 2013-03-22 LAB — TROPONIN I

## 2013-03-22 MED ORDER — ALBUTEROL SULFATE (2.5 MG/3ML) 0.083% IN NEBU
5.0000 mg | INHALATION_SOLUTION | Freq: Once | RESPIRATORY_TRACT | Status: AC
  Administered 2013-03-22: 5 mg via RESPIRATORY_TRACT
  Filled 2013-03-22: qty 6

## 2013-03-22 NOTE — H&P (Signed)
PCP:   CLARK,PRESTON S, MD   Chief Complaint:  sob  HPI: 80 yo male with h/o chronic resp failure on 2 L oxygen at home cont, pulm fibrosis, htn, ckd cad, comes in with over one week of more doe and feeling like he is gaining wt in his legs and abdomen girth.  Denies fevers.  no worse cough than usual.  No n/v/d.  Taking lasix 20mg po daily but doesn't feel like it makes him urinate like it use to, has been on that dose for over a year.  He had a uri about 3 weeks ago and was treated with unk abx, and also just hasnt felt back to his normal since then.  No chest pain.  No pnd or orlthopnea.  Today a friend came to ring his doorbell, he got up from the sofa, walked to the door, and walked back to the sofa.  By the time he got back to the sofa, he got very weak and sob.  This improved with rest.  When ems got there his oxygen sats were in the mid 70s on his 2 Liters, this increased with increasing his oxygen demands.  His other vitals were normal.  Review of Systems:  Positive and negative as per HPI otherwise all other systems are negative  Past Medical History: Past Medical History  Diagnosis Date  . Hypertension   . Gout   . Hyperlipidemia   . Prostate cancer   . CAD (coronary artery disease) 2012    50-60% stenosia 3rd OM, 2nd diad of LAD 80% stenosis, EF 60%  . COPD (chronic obstructive pulmonary disease)     with home oxygen  . Anxiety and depression   . Hiatal hernia   . Pulmonary fibrosis 12/23/2011    Followed in Pulmonary clinic/ [facility]/ [facility]  - First suggested on cxr 12/2004 but clinically limiting activity since 2012 - PFT's 11/24/11  VC 2.94 and dllc 29%, FEV1 80%, ratio 74, no change w/ BD  - 12/23/2011  Walked RA x 2 laps @ 185 ft each stopped due to  desat to 86% -01/14/2012  Walked RA  2 laps @ 185 ft each stopped due to  desat to 87%  - ESR 72 12/23/11  > started steroids 1/23  . CKD (chronic kidney disease) 08/07/2011  . History of colon polyps   . Candida  esophagitis   . H/O echocardiogram 2009    EF > 55% tr MR, tr TR  . H/O cardiovascular stress test 04/19/12    Low risk nuc. stress test   Past Surgical History  Procedure Laterality Date  . Cardiac catheterization  04/04/2010    80% stenosis of sm 2nd diag, 60-605 3rd OM, EF 60 %  . Skin graft      rt arm  . Colonoscopy      x 2- Dr. Edwards    Medications: Prior to Admission medications   Medication Sig Start Date End Date Taking? Authorizing Provider  aspirin EC 81 MG tablet Take 81 mg by mouth every morning.    Yes Historical Provider, MD  azithromycin (ZITHROMAX) 250 MG tablet Take 250 mg by mouth daily. 03/20/13  Yes Historical Provider, MD  Biotin (SUPER BIOTIN) 5 MG TABS Take 1 tablet by mouth daily.   Yes Historical Provider, MD  calcium carbonate (TUMS - DOSED IN MG ELEMENTAL CALCIUM) 500 MG chewable tablet Per bottle as needed for heartburn   Yes Historical Provider, MD  Calcium Carbonate-Vitamin D (CALCIUM   600 + D PO) Take 1 tablet by mouth at bedtime.   Yes Historical Provider, MD  dextromethorphan-guaiFENesin (MUCINEX DM) 30-600 MG per 12 hr tablet Take 1 tablet by mouth 2 (two) times daily as needed.    Yes Historical Provider, MD  diltiazem (CARDIZEM) 30 MG tablet Take 1 tablet (30 mg total) by mouth 2 (two) times daily. Appointment needed for refills. 02/25/13  Yes Thomas A Kelly, MD  famotidine (PEPCID) 20 MG tablet Take 20 mg by mouth at bedtime.  12/23/11  Yes Michael B Wert, MD  febuxostat (ULORIC) 40 MG tablet Take 40 mg by mouth daily.   Yes Historical Provider, MD  furosemide (LASIX) 20 MG tablet Take 20 mg by mouth daily. 09/22/12  Yes Michael B Wert, MD  ketotifen (ZADITOR) 0.025 % ophthalmic solution Place 1 drop into both eyes 2 (two) times daily.   Yes Historical Provider, MD  methylPREDNISolone (MEDROL) 16 MG tablet Take 8 mg by mouth every morning.  06/28/12  Yes Michael B Wert, MD  Multiple Vitamins-Minerals (MULTIVITAMINS THER. W/MINERALS) TABS Take 1 tablet by  mouth every morning.    Yes Historical Provider, MD  Olopatadine HCl (PATADAY OP) Apply 1 drop to eye 2 (two) times daily.   Yes Historical Provider, MD  OXYGEN-HELIUM IN Inhale 3 L into the lungs continuous.    Yes Historical Provider, MD  pantoprazole (PROTONIX) 20 MG tablet Take 40 mg by mouth daily.   Yes Historical Provider, MD  Polyethyl Glycol-Propyl Glycol (SYSTANE OP) Apply 1-2 drops to eye as needed (dry eyes.).   Yes Historical Provider, MD  simethicone (MYLICON) 125 MG chewable tablet Chew by mouth. Per bottle as needed for gas/bloating   Yes Historical Provider, MD  Tamsulosin HCl (FLOMAX) 0.4 MG CAPS Take 0.8 mg by mouth at bedtime.    Yes Historical Provider, MD    Allergies:   Allergies  Allergen Reactions  . Colcrys [Colchicine] Itching  . Other Other (See Comments)    Medication-NapraPAC (prevacid and naprosyn) Reaction-Hiccups  . Prednisone Other (See Comments)    Hiccups    Social History:  reports that he quit smoking about 15 years ago. His smoking use included Cigarettes. He has a 40 pack-year smoking history. He has never used smokeless tobacco. He reports that he does not drink alcohol or use illicit drugs.  Family History: Family History  Problem Relation Age of Onset  . Diabetes type II Father   . Prostate cancer Father   . Prostate cancer Brother   . Throat cancer Brother     smoked  . Heart disease Father     Physical Exam: Filed Vitals:   03/19/2013 2150 04/08/2013 2200 04/04/2013 2230 03/21/2013 2320  BP:  113/95 125/71   Pulse: 108 106 96 91  Temp:      TempSrc:      Resp: 21 26 19 24  SpO2: 96% 93% 100% 99%   General appearance: alert, cooperative and no distress Head: Normocephalic, without obvious abnormality, atraumatic Eyes: negative Nose: Nares normal. Septum midline. Mucosa normal. No drainage or sinus tenderness. Neck: no JVD and supple, symmetrical, trachea midline Lungs: rales bibasilar Heart: regular rate and rhythm, S1, S2 normal,  no murmur, click, rub or gallop Abdomen: soft, non-tender; bowel sounds normal; no masses,  no organomegaly Extremities: edema trace edema Pulses: 2+ and symmetric Skin: Skin color, texture, turgor normal. No rashes or lesions Neurologic: Grossly normal    Labs on Admission:   Recent Labs  03/26/2013 2016    NA 142  K 3.6*  CL 100  CO2 23  GLUCOSE 142*  BUN 17  CREATININE 1.42*  CALCIUM 9.7    Recent Labs  04/04/2013 2016  WBC 10.1  HGB 12.6*  HCT 37.1*  MCV 93.5  PLT 188    Recent Labs  04/09/2013 2016  TROPONINI <0.30    Radiological Exams on Admission: Dg Chest 2 View (if Patient Has Fever And/or Copd)  04/04/2013   CLINICAL DATA:  SHORTNESS OF BREATH WEAKNESS  EXAM: CHEST  2 VIEW  COMPARISON:  CT CHEST W/O CM dated 07/08/2012; DG CHEST 2 VIEW dated 04/28/2012  FINDINGS: Low lung volumes. Cardiac silhouette is enlarged. Aorta is tortuous and ectatic. There is diffuse thickening of the interstitial markings, areas of peribronchial cuffing, and mild diffuse ground-glass density throughout both lungs. No focal regions of consolidation are appreciated. The osseous structures unremarkable.  IMPRESSION: Diffuse interstitial infiltrate accentuated by the low lung volumes. Underlying component of pulmonary edema is of diagnostic consideration as well as chronic bronchitic changes.   Electronically Signed   By: Margaree Mackintosh M.D.   On: 04/02/2013 21:26    Assessment/Plan  80 yo male with pulm fibrosis, worsening hypoxia with exertion, generalized weakness  Principal Problem:   Weakness-  Denies any loc/syncope.  Probably from worsening hypoxia with activity, unclear if this is progression of his pulmonary fibrosis, or some mild fluid overload.  Will increase his diuretics while here overnight, would increase his lasix at home also at discharge.  Will repeat 2 v cxr in am.  Active Problems:   CKD (chronic kidney disease)  stable   Pulmonary fibrosis  Cont oxgyen, nebs,  Hold off  on steroids at this time.  Hold off on abx also, wbc is normal and no fever.  Will diurese first.   Chronic respiratory failure   COPD (chronic obstructive pulmonary disease)    Walterine Amodei A 04/07/2013, 11:31 PM

## 2013-03-22 NOTE — ED Provider Notes (Signed)
CSN: [id number]     Arrival date & time 03/20/2013  1953 History   First MD Initiated Contact with Patient 03/23/2013 2059     Chief Complaint  Patient presents with  . Shortness of Breath  . Weakness     (Consider location/radiation/quality/duration/timing/severity/associated sxs/prior Treatment) HPI Comments: 80 year old male presents by EMS after syncope versus near-syncope. The patient has been diffusely weak and short of breath the last couple weeks. He has a history of COPD and pulmonary fibrosis. His weakness and shortness of breath and progressive. He was treated for some upper respiratory infection with antibiotics a couple weeks ago. He has not any fevers recently but feels his cough has worsened over the last few days. He was noted to be hypoxic to the 70s by EMS, they gave him albuterol and he seemed improved. The patient states the shortness of breath is similar to baseline at this time. He does not remember any chest pain or palpitations. No orthopnea. No leg swelling. Patient side diarrhea or vomiting. His wife states that the patient went to answer the door for the wife and on his way back to the couch he fell due to weakness and may or may not a passed out. Patient is unsure if he passed out. He did not hit his head or have a headache.   Past Medical History  Diagnosis Date  . Hypertension   . Gout   . Hyperlipidemia   . Prostate cancer   . CAD (coronary artery disease) 2012    50-60% stenosia 3rd OM, 2nd diad of LAD 80% stenosis, EF 60%  . COPD (chronic obstructive pulmonary disease)     with home oxygen  . Anxiety and depression   . Hiatal hernia   . Pulmonary fibrosis 12/23/2011    Followed in Pulmonary clinic/ [facility]/ [facility]  - First suggested on cxr 12/2004 but clinically limiting activity since 2012 - PFT's 11/24/11  VC 2.94 and dllc 29%, FEV1 80%, ratio 74, no change w/ BD  - 12/23/2011  Walked RA x 2 laps @ 185 ft each stopped due to  desat to 86% -01/14/2012   Walked RA  2 laps @ 185 ft each stopped due to  desat to 87%  - ESR 72 12/23/11  > started steroids 1/23  . CKD (chronic kidney disease) 08/07/2011  . History of colon polyps   . Candida esophagitis   . H/O echocardiogram 2009    EF > 55% tr MR, tr TR  . H/O cardiovascular stress test 04/19/12    Low risk nuc. stress test   Past Surgical History  Procedure Laterality Date  . Cardiac catheterization  04/04/2010    80% stenosis of sm 2nd diag, 60-605 3rd OM, EF 60 %  . Skin graft      rt arm  . Colonoscopy      x 2- Dr. Oletta Lamas   Family History  Problem Relation Age of Onset  . Diabetes type II Father   . Prostate cancer Father   . Prostate cancer Brother   . Throat cancer Brother     smoked  . Heart disease Father    History  Substance Use Topics  . Smoking status: Former Smoker -- 1.00 packs/day for 40 years    Types: Cigarettes    Quit date: 01/19/1998  . Smokeless tobacco: Never Used  . Alcohol Use: No    Review of Systems  Constitutional: Negative for fever.  Respiratory: Positive for cough and shortness of  breath.   Cardiovascular: Negative for chest pain and leg swelling.  Gastrointestinal: Negative for nausea, vomiting and diarrhea.  Neurological: Positive for syncope and weakness. Negative for headaches.  All other systems reviewed and are negative.      Allergies  Colcrys; Other; and Prednisone  Home Medications   Current Outpatient Rx  Name  Route  Sig  Dispense  Refill  . aspirin EC 81 MG tablet   Oral   Take 81 mg by mouth every morning.          Marland Kitchen azithromycin (ZITHROMAX) 250 MG tablet   Oral   Take 250 mg by mouth daily.         . Biotin (SUPER BIOTIN) 5 MG TABS   Oral   Take 1 tablet by mouth daily.         . calcium carbonate (TUMS - DOSED IN MG ELEMENTAL CALCIUM) 500 MG chewable tablet      Per bottle as needed for heartburn         . Calcium Carbonate-Vitamin D (CALCIUM 600 + D PO)   Oral   Take 1 tablet by mouth at  bedtime.         Marland Kitchen dextromethorphan-guaiFENesin (MUCINEX DM) 30-600 MG per 12 hr tablet   Oral   Take 1 tablet by mouth 2 (two) times daily as needed.          . diltiazem (CARDIZEM) 30 MG tablet   Oral   Take 1 tablet (30 mg total) by mouth 2 (two) times daily. Appointment needed for refills.   60 tablet   11   . famotidine (PEPCID) 20 MG tablet   Oral   Take 20 mg by mouth at bedtime.          . febuxostat (ULORIC) 40 MG tablet   Oral   Take 40 mg by mouth daily.         . furosemide (LASIX) 20 MG tablet   Oral   Take 20 mg by mouth daily.         Marland Kitchen ketotifen (ZADITOR) 0.025 % ophthalmic solution   Both Eyes   Place 1 drop into both eyes 2 (two) times daily.         . methylPREDNISolone (MEDROL) 16 MG tablet   Oral   Take 8 mg by mouth every morning.          . Multiple Vitamins-Minerals (MULTIVITAMINS THER. W/MINERALS) TABS   Oral   Take 1 tablet by mouth every morning.          . Olopatadine HCl (PATADAY OP)   Ophthalmic   Apply 1 drop to eye 2 (two) times daily.         . OXYGEN-HELIUM IN   Inhalation   Inhale 3 L into the lungs continuous.          . pantoprazole (PROTONIX) 20 MG tablet   Oral   Take 40 mg by mouth daily.         Vladimir Faster Glycol-Propyl Glycol (SYSTANE OP)   Ophthalmic   Apply 1-2 drops to eye as needed (dry eyes.).         Marland Kitchen simethicone (MYLICON) 924 MG chewable tablet   Oral   Chew by mouth. Per bottle as needed for gas/bloating         . Tamsulosin HCl (FLOMAX) 0.4 MG CAPS   Oral   Take 0.8 mg by mouth at bedtime.  BP 115/79  Pulse 109  Temp(Src) 97.7 F (36.5 C) (Oral)  Resp 18  SpO2 96% Physical Exam  Nursing note and vitals reviewed. Constitutional: He is oriented to person, place, and time. He appears well-developed and well-nourished.  HENT:  Head: Normocephalic and atraumatic.  Right Ear: External ear normal.  Left Ear: External ear normal.  Nose: Nose normal.  Eyes: Right  eye exhibits no discharge. Left eye exhibits no discharge.  Neck: Neck supple.  Cardiovascular: Normal rate, regular rhythm, normal heart sounds and intact distal pulses.   Pulmonary/Chest: Effort normal. He has rales in the right lower field and the left lower field.  Abdominal: Soft. There is no tenderness.  Musculoskeletal: He exhibits no edema.  Neurological: He is alert and oriented to person, place, and time.  Skin: Skin is warm and dry.    ED Course  Procedures (including critical care time) Labs Review Labs Reviewed  BASIC METABOLIC PANEL - Abnormal; Notable for the following:    Potassium 3.6 (*)    Glucose, Bld 142 (*)    Creatinine, Ser 1.42 (*)    GFR calc non Af Amer 46 (*)    GFR calc Af Amer 53 (*)    All other components within normal limits  CBC - Abnormal; Notable for the following:    RBC 3.97 (*)    Hemoglobin 12.6 (*)    HCT 37.1 (*)    All other components within normal limits  TROPONIN I  PRO B NATRIURETIC PEPTIDE  URINALYSIS, ROUTINE W REFLEX MICROSCOPIC  I-STAT TROPOININ, ED   Imaging Review Dg Chest 2 View (if Patient Has Fever And/or Copd)  04/02/2013   CLINICAL DATA:  SHORTNESS OF BREATH WEAKNESS  EXAM: CHEST  2 VIEW  COMPARISON:  CT CHEST W/O CM dated 07/08/2012; DG CHEST 2 VIEW dated 04/28/2012  FINDINGS: Low lung volumes. Cardiac silhouette is enlarged. Aorta is tortuous and ectatic. There is diffuse thickening of the interstitial markings, areas of peribronchial cuffing, and mild diffuse ground-glass density throughout both lungs. No focal regions of consolidation are appreciated. The osseous structures unremarkable.  IMPRESSION: Diffuse interstitial infiltrate accentuated by the low lung volumes. Underlying component of pulmonary edema is of diagnostic consideration as well as chronic bronchitic changes.   Electronically Signed   By: Margaree Mackintosh M.D.   On: 03/21/2013 21:26     EKG Interpretation   Date/Time:  Wednesday March 22 2013 20:09:41  EST Ventricular Rate:  90 PR Interval:  132 QRS Duration: 82 QT Interval:  355 QTC Calculation: 434 R Axis:   -28 Text Interpretation:  Age not entered, assumed to be  80 years old for  purpose of ECG interpretation Sinus rhythm Borderline left axis deviation  Baseline wander in lead(s) V2 Confirmed by Pranika Finks  MD, Jahmez Bily ([id number]) on  03/27/2013 11:06:54 PM      MDM   Final diagnoses:  Weakness  Syncope    Patient appears chronically ill, now after albuterol feels improved. Unk why he had syncope vs near syncope, could be afib with RVR (hx of afib) vs his hypoxia. His hypoxia has been progressive per him and notes, likely from his significant lung disease. He does not feel comfortable going home, and at this time is overall weak. Will admit for observation.    Ephraim Hamburger, MD 03/23/13 1500

## 2013-03-22 NOTE — ED Notes (Signed)
Weak and short of breath x 2 weeks.

## 2013-03-22 NOTE — ED Notes (Signed)
Pt transported to XRAY °

## 2013-03-22 NOTE — ED Notes (Signed)
Pt's wife called ems for increasing weakness and short of breath, low sats at home in 70's, ems gave albuterol treatment, pt has extensive respiratory history

## 2013-03-23 ENCOUNTER — Observation Stay (HOSPITAL_COMMUNITY): Payer: Medicare Other

## 2013-03-23 ENCOUNTER — Encounter (HOSPITAL_COMMUNITY)
Admission: RE | Admit: 2013-03-23 | Discharge: 2013-03-23 | Disposition: A | Discharge status: Home / Self Care | Payer: Medicare Other | Source: Ambulatory Visit | Attending: Internal Medicine | Admitting: Internal Medicine

## 2013-03-23 DIAGNOSIS — I517 Cardiomegaly: Secondary | ICD-10-CM

## 2013-03-23 DIAGNOSIS — J8410 Pulmonary fibrosis, unspecified: Secondary | ICD-10-CM | POA: Insufficient documentation

## 2013-03-23 DIAGNOSIS — J438 Other emphysema: Secondary | ICD-10-CM | POA: Insufficient documentation

## 2013-03-23 DIAGNOSIS — Z5189 Encounter for other specified aftercare: Secondary | ICD-10-CM | POA: Insufficient documentation

## 2013-03-23 LAB — URINALYSIS, ROUTINE W REFLEX MICROSCOPIC
Bilirubin Urine: NEGATIVE
Glucose, UA: NEGATIVE mg/dL
Hgb urine dipstick: NEGATIVE
Ketones, ur: NEGATIVE mg/dL
Leukocytes, UA: NEGATIVE
NITRITE: NEGATIVE
Protein, ur: NEGATIVE mg/dL
Specific Gravity, Urine: 1.011 (ref 1.005–1.030)
UROBILINOGEN UA: 0.2 mg/dL (ref 0.0–1.0)
pH: 6.5 (ref 5.0–8.0)

## 2013-03-23 LAB — BASIC METABOLIC PANEL
BUN: 19 mg/dL (ref 6–23)
CHLORIDE: 101 meq/L (ref 96–112)
CO2: 23 mEq/L (ref 19–32)
CREATININE: 1.35 mg/dL (ref 0.50–1.35)
Calcium: 9.1 mg/dL (ref 8.4–10.5)
GFR calc Af Amer: 56 mL/min — ABNORMAL LOW (ref >90)
GFR calc non Af Amer: 49 mL/min — ABNORMAL LOW (ref >90)
GLUCOSE: 189 mg/dL — AB (ref 70–99)
Potassium: 3.1 mEq/L — ABNORMAL LOW (ref 3.7–5.3)
Sodium: 144 mEq/L (ref 137–147)

## 2013-03-23 LAB — CBC
HCT: 36 % — ABNORMAL LOW (ref 39.0–52.0)
Hemoglobin: 12.1 g/dL — ABNORMAL LOW (ref 13.0–17.0)
MCH: 31.6 pg (ref 26.0–34.0)
MCHC: 33.6 g/dL (ref 30.0–36.0)
MCV: 94 fL (ref 78.0–100.0)
Platelets: 167 10*3/uL (ref 150–400)
RBC: 3.83 MIL/uL — ABNORMAL LOW (ref 4.22–5.81)
RDW: 14.4 % (ref 11.5–15.5)
WBC: 10.7 10*3/uL — ABNORMAL HIGH (ref 4.0–10.5)

## 2013-03-23 LAB — PRO B NATRIURETIC PEPTIDE: Pro B Natriuretic peptide (BNP): 123.8 pg/mL (ref 0–450)

## 2013-03-23 MED ORDER — FUROSEMIDE 20 MG PO TABS
20.0000 mg | ORAL_TABLET | Freq: Every day | ORAL | Status: DC
  Administered 2013-03-24 – 2013-03-25 (×2): 20 mg via ORAL
  Filled 2013-03-23 (×3): qty 1

## 2013-03-23 MED ORDER — POTASSIUM CHLORIDE CRYS ER 20 MEQ PO TBCR
40.0000 meq | EXTENDED_RELEASE_TABLET | Freq: Once | ORAL | Status: AC
  Administered 2013-03-23: 40 meq via ORAL
  Filled 2013-03-23: qty 2

## 2013-03-23 MED ORDER — SODIUM CHLORIDE 0.9 % IJ SOLN
3.0000 mL | Freq: Two times a day (BID) | INTRAMUSCULAR | Status: DC
  Administered 2013-03-23 – 2013-03-29 (×6): 3 mL via INTRAVENOUS

## 2013-03-23 MED ORDER — IPRATROPIUM BROMIDE 0.02 % IN SOLN
0.5000 mg | Freq: Four times a day (QID) | RESPIRATORY_TRACT | Status: DC
  Administered 2013-03-23 – 2013-03-25 (×9): 0.5 mg via RESPIRATORY_TRACT
  Filled 2013-03-23 (×9): qty 2.5

## 2013-03-23 MED ORDER — ASPIRIN EC 81 MG PO TBEC
81.0000 mg | DELAYED_RELEASE_TABLET | Freq: Every morning | ORAL | Status: DC
  Administered 2013-03-23 – 2013-03-25 (×3): 81 mg via ORAL
  Filled 2013-03-23 (×3): qty 1

## 2013-03-23 MED ORDER — GUAIFENESIN-DM 100-10 MG/5ML PO SYRP: 5.0000 mL | ORAL_SOLUTION | ORAL | Status: DC | PRN

## 2013-03-23 MED ORDER — LEVALBUTEROL HCL 0.63 MG/3ML IN NEBU
0.6300 mg | INHALATION_SOLUTION | Freq: Four times a day (QID) | RESPIRATORY_TRACT | Status: DC
  Administered 2013-03-23 – 2013-03-25 (×9): 0.63 mg via RESPIRATORY_TRACT
  Filled 2013-03-23 (×21): qty 3

## 2013-03-23 MED ORDER — FUROSEMIDE 10 MG/ML IJ SOLN
40.0000 mg | Freq: Two times a day (BID) | INTRAMUSCULAR | Status: DC
  Administered 2013-03-23 (×2): 40 mg via INTRAVENOUS
  Filled 2013-03-23 (×4): qty 4

## 2013-03-23 MED ORDER — SODIUM CHLORIDE 0.9 % IJ SOLN: 3.0000 mL | INTRAMUSCULAR | Status: DC | PRN

## 2013-03-23 MED ORDER — SODIUM CHLORIDE 0.9 % IV SOLN: 250.0000 mL | INTRAVENOUS | Status: DC | PRN

## 2013-03-23 MED ORDER — LEVALBUTEROL HCL 0.63 MG/3ML IN NEBU
0.6300 mg | INHALATION_SOLUTION | Freq: Four times a day (QID) | RESPIRATORY_TRACT | Status: DC | PRN
  Filled 2013-03-23: qty 3

## 2013-03-23 MED ORDER — DILTIAZEM HCL 30 MG PO TABS
30.0000 mg | ORAL_TABLET | Freq: Two times a day (BID) | ORAL | Status: DC
  Administered 2013-03-23 – 2013-03-25 (×5): 30 mg via ORAL
  Filled 2013-03-23 (×6): qty 1

## 2013-03-23 MED ORDER — FEBUXOSTAT 40 MG PO TABS
40.0000 mg | ORAL_TABLET | Freq: Every day | ORAL | Status: DC
  Administered 2013-03-23 – 2013-03-25 (×3): 40 mg via ORAL
  Filled 2013-03-23 (×3): qty 1

## 2013-03-23 MED ORDER — FAMOTIDINE 20 MG PO TABS
20.0000 mg | ORAL_TABLET | Freq: Every day | ORAL | Status: DC
  Administered 2013-03-23 – 2013-03-24 (×3): 20 mg via ORAL
  Filled 2013-03-23 (×4): qty 1

## 2013-03-23 MED ORDER — TAMSULOSIN HCL 0.4 MG PO CAPS
0.8000 mg | ORAL_CAPSULE | Freq: Every day | ORAL | Status: DC
  Administered 2013-03-23 – 2013-03-24 (×3): 0.8 mg via ORAL
  Filled 2013-03-23 (×4): qty 2

## 2013-03-23 MED ORDER — KETOTIFEN FUMARATE 0.025 % OP SOLN
1.0000 [drp] | Freq: Two times a day (BID) | OPHTHALMIC | Status: DC
  Administered 2013-03-23 – 2013-03-29 (×13): 1 [drp] via OPHTHALMIC
  Filled 2013-03-23: qty 5

## 2013-03-23 MED ORDER — PANTOPRAZOLE SODIUM 40 MG PO TBEC
40.0000 mg | DELAYED_RELEASE_TABLET | Freq: Every day | ORAL | Status: DC
  Administered 2013-03-23 – 2013-03-25 (×3): 40 mg via ORAL
  Filled 2013-03-23 (×3): qty 1

## 2013-03-23 NOTE — Progress Notes (Signed)
Echocardiogram 2D Echocardiogram has been performed.  James Pope 03/23/2013, 2:24 PM

## 2013-03-23 NOTE — Progress Notes (Addendum)
[facility] PROGRESS NOTE  DERIC BOCOCK TOI:[id number] DOB: 1932-08-21 DOA: April 02, 2013 PCP: Foye Spurling, MD  Brief narrative: 80 yo male with h/o chronic resp failure secondary to pulmonary fibrosis, oxygen dependent, on 2 L oxygen at home, COPD, CKD who presented to [facility] ED 04-02-2013 with complaints of lower extremity swelling and weight gain for past one week prior to this admission. He reports shortness of breath but no chest pain or palpitations or orthopnea. On admission, his oxygen saturation was in need of 78 on 2 L nasal cannula oxygen support. Chest x-ray on admission showed no acute cardiopulmonary disease but stable chronic interstitial disease likely pulmonary fibrosis.  Assessment/Plan:  Principal Problem:   Acute respiratory failure with hypoxia - Likely secondary to combination of pulmonary fibrosis and COPD versus CHF - No evidence of acute infection/pneumonia on chest x-ray. Patient afebrile with normal white blood cell count on the admission - Respiratory status remains stable and patient saturating above 95% on 2 L nasal cannula at this time - Continue Atrovent every 6 hours scheduled and Xopenex every 6 hours scheduled - May use Atrovent every 6 hours as needed for shortness of breath or wheezing - In regards to CHF we will obtain BNP and 2-D echo. Patient was started on Lasix 40 mg IV twice a day. We will change Lasix to home dose due to low blood pressure, 98/72 Active Problems:   Acute diastolic CHF - Obtain BNP and 2-D echo - Stop IV Lasix because of her blood pressure, blood pressure 98/72 - Check daily weight and replete electrolytes as needed   Hypokalemia - Secondary to Lasix - Repleted today - Followup BMP in the morning   Acute renal failure - Likely secondary to dehydration - It has resolved to normal limits with IV fluids   Hypertension - Continue Cardizem 30 mg by mouth twice a day   BPH - Continue Flomax 0.8 mg at bedtime   Code Status:  full code Family Communication: no family at the bedside  Disposition Plan: home when stable; follow up PT eval  Leisa Lenz, MD  [facility] Pager [phone]  If 7PM-7AM, please contact night-coverage [email or web address] Password [email or web address] 03/23/2013, 9:18 AM   LOS: 1 day   Consultants:  None   Procedures:  None   Antibiotics:  None   HPI/Subjective: Feels better this am, no shortness of breath.   Objective: Filed Vitals:   03/23/13 0154 03/23/13 0215 03/23/13 0656 03/23/13 0745  BP: 93/63  98/72   Pulse: 117  95   Temp:   99.1 F (37.3 C)   TempSrc:   Oral   Resp:   20   Height:      Weight:      SpO2:  92% 98% 93%    Intake/Output Summary (Last 24 hours) at 03/23/13 5397 Last data filed at 03/23/13 0500  Gross per 24 hour  Intake      0 ml  Output   1201 ml  Net  -1201 ml    Exam:   General:  Pt is alert, follows commands appropriately, not in acute distress  Cardiovascular: Regular rate and rhythm, S1/S2 appreciated   Respiratory: Clear to auscultation bilaterally, no wheezing, no crackles, no rhonchi  Abdomen: Soft, non tender, non distended, bowel sounds present, no guarding  Extremities: +1 non pitting edema, pulses DP and PT palpable bilaterally  Neuro: Grossly nonfocal  Data Reviewed: Basic Metabolic Panel:  Recent Labs Lab 04/02/13 2016 03/23/13 0328  NA 142 144  K 3.6* 3.1*  CL 100 101  CO2 23 23  GLUCOSE 142* 189*  BUN 17 19  CREATININE 1.42* 1.35  CALCIUM 9.7 9.1   Liver Function Tests: No results found for this basename: AST, ALT, ALKPHOS, BILITOT, PROT, ALBUMIN,  in the last 168 hours No results found for this basename: LIPASE, AMYLASE,  in the last 168 hours No results found for this basename: AMMONIA,  in the last 168 hours CBC:  Recent Labs Lab 04/12/2013 2016 03/23/13 0328  WBC 10.1 10.7*  HGB 12.6* 12.1*  HCT 37.1* 36.0*  MCV 93.5 94.0  PLT 188 167   Cardiac Enzymes:  Recent Labs Lab 04/12/2013 2016   TROPONINI <0.30   BNP: No components found with this basename: POCBNP,  CBG: No results found for this basename: GLUCAP,  in the last 168 hours  No results found for this or any previous visit (from the past 240 hour(s)).   Studies: Dg Chest 2 View (if Patient Has Fever And/or Copd) 04/09/2013  IMPRESSION: Diffuse interstitial infiltrate accentuated by the low lung volumes. Underlying component of pulmonary edema is of diagnostic consideration as well as chronic bronchitic changes.     Scheduled Meds: . aspirin EC  81 mg Oral q morning - 10a  . diltiazem  30 mg Oral BID  . famotidine  20 mg Oral QHS  . febuxostat  40 mg Oral Daily  . furosemide  40 mg Intravenous BID  . ipratropium  0.5 mg Nebulization Q6H  . ketotifen  1 drop Both Eyes BID  . levalbuterol  0.63 mg Nebulization Q6H  . pantoprazole  40 mg Oral Daily  . potassium chloride  40 mEq Oral Once  . tamsulosin  0.8 mg Oral QHS

## 2013-03-23 NOTE — Progress Notes (Signed)
UR completed. Patient changed to inpatient- requiring IV lasix TID

## 2013-03-24 ENCOUNTER — Inpatient Hospital Stay (HOSPITAL_COMMUNITY): Payer: Medicare Other

## 2013-03-24 DIAGNOSIS — I10 Essential (primary) hypertension: Secondary | ICD-10-CM

## 2013-03-24 LAB — BASIC METABOLIC PANEL
BUN: 21 mg/dL (ref 6–23)
CO2: 28 mEq/L (ref 19–32)
Calcium: 9.4 mg/dL (ref 8.4–10.5)
Chloride: 98 mEq/L (ref 96–112)
Creatinine, Ser: 1.26 mg/dL (ref 0.50–1.35)
GFR calc Af Amer: 61 mL/min — ABNORMAL LOW (ref >90)
GFR, EST NON AFRICAN AMERICAN: 53 mL/min — AB (ref >90)
Glucose, Bld: 113 mg/dL — ABNORMAL HIGH (ref 70–99)
Potassium: 3.8 mEq/L (ref 3.7–5.3)
Sodium: 140 mEq/L (ref 137–147)

## 2013-03-24 MED ORDER — LORAZEPAM 2 MG/ML IJ SOLN
0.5000 mg | Freq: Once | INTRAMUSCULAR | Status: AC
  Administered 2013-03-24: 0.5 mg via INTRAVENOUS
  Filled 2013-03-24: qty 1

## 2013-03-24 MED ORDER — ACETAMINOPHEN 325 MG PO TABS
650.0000 mg | ORAL_TABLET | Freq: Four times a day (QID) | ORAL | Status: DC | PRN
  Administered 2013-03-24 – 2013-03-25 (×3): 650 mg via ORAL
  Filled 2013-03-24 (×3): qty 2

## 2013-03-24 MED ORDER — ACETAMINOPHEN 325 MG PO TABS: 650.0000 mg | ORAL_TABLET | Freq: Once | ORAL | Status: AC

## 2013-03-24 NOTE — Progress Notes (Signed)
New order received from Dr. Charlies Silvers , Tylenol 650 mg, patient temperature was 101.6 oral.Will continue to monitor the patient.- Sandie Ano RN

## 2013-03-24 NOTE — Progress Notes (Signed)
RN called RT because patient sats were in the 70's. RT placed pt on 35% venti mask. Pt tolerated well for a couple of min but desated. RT increased venti mask to 50% and patient is tolerating well at this time. Sats are in the 90's. RT will continue to monitor.

## 2013-03-24 NOTE — Care Management Note (Signed)
Cm spoke with patient at the bedside concerning discharge planning. Per pt on home oxygen therapy. Cm spoke with patient's spouse who verified that patient's oxygen provided by [facility]. Pt's spouse to bring portable tank to hospital prior to discharge. MD orders for [facility]. Pt's spouse provided choice for [facility]. Per pt's spouse Arville Go to provide [facility] services. No other needs identified.   Venita Lick Osaze Hubbert,MSN,RN [phone]

## 2013-03-24 NOTE — Progress Notes (Signed)
RT called [facility] (home 02 provider for this PT based on note from 03/24/13)- spoke with Lorainne, she will have [place] call RT # [id number] concerning 02 therapy versus heliox therapy at home.

## 2013-03-24 NOTE — Progress Notes (Signed)
Pts.O2 sat on RA with ambulation was 89% at rest 94% with oxygen at 3L, and ambulation with Oxygen at 3L was 90%.Sandie Ano RN

## 2013-03-24 NOTE — Progress Notes (Signed)
[facility] PROGRESS NOTE  CLARICE BONAVENTURE NOI:[id number] DOB: April 16, 1932 DOA: 04/11/2013 PCP: Foye Spurling, MD  Brief narrative: 80 yo male with h/o chronic resp failure secondary to pulmonary fibrosis, oxygen dependent, on 2 L oxygen at home, COPD, CKD who presented to [facility] ED 03/20/2013 with complaints of lower extremity swelling and weight gain for past one week prior to this admission. He reports shortness of breath but no chest pain or palpitations or orthopnea. On admission, his oxygen saturation was in need of 78 on 2 L nasal cannula oxygen support. Chest x-ray on admission showed no acute cardiopulmonary disease but stable chronic interstitial disease likely pulmonary fibrosis.   Assessment/Plan:   Principal Problem:  Acute respiratory failure with hypoxia  - Likely secondary to combination of pulmonary fibrosis and COPD versus CHF  - No evidence of acute infection/pneumonia on chest x-ray. Patient afebrile with normal white blood cell count on the admission  - Respiratory status remains stable but requires oxygen support via [place] to keep O2 saturation above 90% - Continue Atrovent every 6 hours scheduled and Xopenex every 6 hours scheduled and as neede - In regards to CHF, BNP was only mildly up at 123; follow up 2 D ECHO results. Lasix only given on admission.  Active Problems:  Hypokalemia  - Secondary to Lasix  - Repleted and WNL this am Acute renal failure  - Likely secondary to dehydration  - It has resolved to normal limits with IV fluids  Hypertension  - Continue Cardizem 30 mg by mouth twice a day  BPH  - Continue Flomax 0.8 mg at bedtime   Code Status: full code  Family Communication: no family at the bedside  Disposition Plan: home when stable likely in next 24 hours  Consultants:  None  Procedures:  None  Antibiotics:  None    Leisa Lenz, MD  [facility] Pager [phone]  If 7PM-7AM, please contact night-coverage [email or web address] Password  [email or web address] 03/24/2013, 1:48 PM   LOS: 2 days    HPI/Subjective: He felt more short of breath this am  Objective: Filed Vitals:   03/23/13 2244 03/24/13 0651 03/24/13 0739 03/24/13 1009  BP: 123/90 102/60  113/110  Pulse: 118 109    Temp: 97.6 F (36.4 C) 99.9 F (37.7 C)    TempSrc: Oral Oral    Resp: 20 20    Height:      Weight:  80.2 kg (176 lb 12.9 oz)    SpO2: 94% 94% 95%     Intake/Output Summary (Last 24 hours) at 03/24/13 1348 Last data filed at 03/24/13 0900  Gross per 24 hour  Intake    720 ml  Output   1150 ml  Net   -430 ml    Exam:   General:  Pt is alert, follows commands appropriately, not in acute distress  Cardiovascular: Regular rate and rhythm, S1/S2 appreciated  Respiratory: diminished breath sounds, no wheezing   Abdomen: Soft, non tender, non distended, bowel sounds present, no guarding  Extremities: No edema, pulses DP and PT palpable bilaterally  Neuro: Grossly nonfocal  Data Reviewed: Basic Metabolic Panel:  Recent Labs Lab 04/08/2013 2016 03/23/13 0328 03/24/13 0315  NA 142 144 140  K 3.6* 3.1* 3.8  CL 100 101 98  CO2 23 23 28   GLUCOSE 142* 189* 113*  BUN 17 19 21   CREATININE 1.42* 1.35 1.26  CALCIUM 9.7 9.1 9.4   Liver Function Tests: No results found for this basename: AST, ALT, ALKPHOS, BILITOT, PROT,  ALBUMIN,  in the last 168 hours No results found for this basename: LIPASE, AMYLASE,  in the last 168 hours No results found for this basename: AMMONIA,  in the last 168 hours CBC:  Recent Labs Lab 2013/04/04 2016 03/23/13 0328  WBC 10.1 10.7*  HGB 12.6* 12.1*  HCT 37.1* 36.0*  MCV 93.5 94.0  PLT 188 167   Cardiac Enzymes:  Recent Labs Lab April 04, 2013 2016  TROPONINI <0.30   BNP: No components found with this basename: POCBNP,  CBG: No results found for this basename: GLUCAP,  in the last 168 hours  No results found for this or any previous visit (from the past 240 hour(s)).   Studies: Dg Chest 2  View  03/23/2013   CLINICAL DATA:  Shortness of breath.  EXAM: CHEST  2 VIEW  COMPARISON:  Apr 04, 2013 as well as chest CT 07/08/2012  FINDINGS: Lungs are hypoinflated with persistent stable interstitial disease likely pulmonary fibrosis. No focal airspace consolidation or effusion. Cardiomediastinal silhouette and remainder of the exam is unchanged.  IMPRESSION: No acute cardiopulmonary disease.  Hypoinflation with chronic stable interstitial disease likely pulmonary fibrosis.   Electronically Signed   By: Marin Olp M.D.   On: 03/23/2013 10:53   Dg Chest 2 View (if Patient Has Fever And/or Copd)  April 04, 2013   CLINICAL DATA:  SHORTNESS OF BREATH WEAKNESS  EXAM: CHEST  2 VIEW  COMPARISON:  CT CHEST W/O CM dated 07/08/2012; DG CHEST 2 VIEW dated 04/28/2012  FINDINGS: Low lung volumes. Cardiac silhouette is enlarged. Aorta is tortuous and ectatic. There is diffuse thickening of the interstitial markings, areas of peribronchial cuffing, and mild diffuse ground-glass density throughout both lungs. No focal regions of consolidation are appreciated. The osseous structures unremarkable.  IMPRESSION: Diffuse interstitial infiltrate accentuated by the low lung volumes. Underlying component of pulmonary edema is of diagnostic consideration as well as chronic bronchitic changes.   Electronically Signed   By: Margaree Mackintosh M.D.   On: April 04, 2013 21:26    Scheduled Meds: . aspirin EC  81 mg Oral q morning - 10a  . diltiazem  30 mg Oral BID  . famotidine  20 mg Oral QHS  . febuxostat  40 mg Oral Daily  . furosemide  20 mg Oral Daily  . ipratropium  0.5 mg Nebulization Q6H  . ketotifen  1 drop Both Eyes BID  . levalbuterol  0.63 mg Nebulization Q6H  . pantoprazole  40 mg Oral Daily  . tamsulosin  0.8 mg Oral QHS   Continuous Infusions:

## 2013-03-24 NOTE — Progress Notes (Signed)
H&P and Prior to admission medication list noted to list oxygen-helium mixture as a home regimen. Clarification unobtainable via RN and/or patient. Noted patient to have been seen by [facility] pulmonology in the past. Therefore, spoke with Dr. Titus Mould ([place]) for possible clarification. It is clarified as being is home medication and is recommended that this be addressed with the primary MD during am rounds since the patient is not currently in respiratory distress or having wheezing. RN aware. Sticky note made for MD.

## 2013-03-24 NOTE — Progress Notes (Signed)
James Pope with [employer] called RT- states PT is on 02 (not heliox) at home. James Pope visits with this PT every 2 weeks and is familiar with details. RN aware.

## 2013-03-25 ENCOUNTER — Inpatient Hospital Stay (HOSPITAL_COMMUNITY): Payer: Medicare Other

## 2013-03-25 DIAGNOSIS — J9600 Acute respiratory failure, unspecified whether with hypoxia or hypercapnia: Secondary | ICD-10-CM

## 2013-03-25 DIAGNOSIS — J189 Pneumonia, unspecified organism: Secondary | ICD-10-CM | POA: Diagnosis present

## 2013-03-25 LAB — BLOOD GAS, ARTERIAL
ACID-BASE EXCESS: 2.4 mmol/L — AB (ref 0.0–2.0)
Acid-Base Excess: 4.2 mmol/L — ABNORMAL HIGH (ref 0.0–2.0)
BICARBONATE: 26.5 meq/L — AB (ref 20.0–24.0)
Bicarbonate: 24.4 mEq/L — ABNORMAL HIGH (ref 20.0–24.0)
Drawn by: 11249
Drawn by: 331471
FIO2: 1 %
FIO2: 1 %
O2 SAT: 98 %
O2 Saturation: 73.6 %
PATIENT TEMPERATURE: 101.3
PH ART: 7.487 — AB (ref 7.350–7.450)
PH ART: 7.49 — AB (ref 7.350–7.450)
Patient temperature: 102
TCO2: 23.1 mmol/L (ref 0–100)
TCO2: 21.3 mmol/L (ref 0–100)
pCO2 arterial: 36.1 mmHg (ref 35.0–45.0)
pCO2 arterial: 32.9 mmHg — ABNORMAL LOW (ref 35.0–45.0)
pO2, Arterial: 112 mmHg — ABNORMAL HIGH (ref 80.0–100.0)
pO2, Arterial: 42 mmHg — ABNORMAL LOW (ref 80.0–100.0)

## 2013-03-25 LAB — COMPREHENSIVE METABOLIC PANEL
ALK PHOS: 63 U/L (ref 39–117)
ALT: 11 U/L (ref 0–53)
AST: 28 U/L (ref 0–37)
Albumin: 2.7 g/dL — ABNORMAL LOW (ref 3.5–5.2)
BILIRUBIN TOTAL: 1.3 mg/dL — AB (ref 0.3–1.2)
BUN: 19 mg/dL (ref 6–23)
CHLORIDE: 95 meq/L — AB (ref 96–112)
CO2: 26 meq/L (ref 19–32)
Calcium: 9.1 mg/dL (ref 8.4–10.5)
Creatinine, Ser: 1.35 mg/dL (ref 0.50–1.35)
GFR, EST AFRICAN AMERICAN: 56 mL/min — AB (ref >90)
GFR, EST NON AFRICAN AMERICAN: 49 mL/min — AB (ref >90)
GLUCOSE: 131 mg/dL — AB (ref 70–99)
POTASSIUM: 3.5 meq/L — AB (ref 3.7–5.3)
SODIUM: 135 meq/L — AB (ref 137–147)
Total Protein: 6.8 g/dL (ref 6.0–8.3)

## 2013-03-25 LAB — CBC WITH DIFFERENTIAL/PLATELET
BASOS ABS: 0.1 10*3/uL (ref 0.0–0.1)
BASOS PCT: 0 % (ref 0–1)
Eosinophils Absolute: 0.1 10*3/uL (ref 0.0–0.7)
Eosinophils Relative: 1 % (ref 0–5)
HCT: 36 % — ABNORMAL LOW (ref 39.0–52.0)
Hemoglobin: 12.4 g/dL — ABNORMAL LOW (ref 13.0–17.0)
Lymphocytes Relative: 8 % — ABNORMAL LOW (ref 12–46)
Lymphs Abs: 0.9 10*3/uL (ref 0.7–4.0)
MCH: 32 pg (ref 26.0–34.0)
MCHC: 34.4 g/dL (ref 30.0–36.0)
MCV: 92.8 fL (ref 78.0–100.0)
MONO ABS: 1.1 10*3/uL — AB (ref 0.1–1.0)
Monocytes Relative: 9 % (ref 3–12)
NEUTROS ABS: 9.7 10*3/uL — AB (ref 1.7–7.7)
Neutrophils Relative %: 82 % — ABNORMAL HIGH (ref 43–77)
PLATELETS: 170 10*3/uL (ref 150–400)
RBC: 3.88 MIL/uL — ABNORMAL LOW (ref 4.22–5.81)
RDW: 14.1 % (ref 11.5–15.5)
WBC: 11.9 10*3/uL — AB (ref 4.0–10.5)

## 2013-03-25 LAB — GLUCOSE, CAPILLARY
GLUCOSE-CAPILLARY: 123 mg/dL — AB (ref 70–99)
GLUCOSE-CAPILLARY: 175 mg/dL — AB (ref 70–99)
Glucose-Capillary: 165 mg/dL — ABNORMAL HIGH (ref 70–99)

## 2013-03-25 LAB — URINALYSIS, ROUTINE W REFLEX MICROSCOPIC
BILIRUBIN URINE: NEGATIVE
Glucose, UA: NEGATIVE mg/dL
HGB URINE DIPSTICK: NEGATIVE
Ketones, ur: NEGATIVE mg/dL
Leukocytes, UA: NEGATIVE
NITRITE: NEGATIVE
PH: 6.5 (ref 5.0–8.0)
Protein, ur: NEGATIVE mg/dL
Specific Gravity, Urine: 1.024 (ref 1.005–1.030)
Urobilinogen, UA: 0.2 mg/dL (ref 0.0–1.0)

## 2013-03-25 LAB — TROPONIN I: Troponin I: <0.3 ng/mL (ref <0.30)

## 2013-03-25 LAB — PROCALCITONIN: Procalcitonin: 0.18 ng/mL

## 2013-03-25 LAB — MRSA PCR SCREENING: MRSA by PCR: NEGATIVE

## 2013-03-25 MED ORDER — SODIUM CHLORIDE 0.9 % IV SOLN
INTRAVENOUS | Status: DC
  Administered 2013-03-25 – 2013-03-29 (×7): via INTRAVENOUS

## 2013-03-25 MED ORDER — POTASSIUM CHLORIDE 10 MEQ/100ML IV SOLN
INTRAVENOUS | Status: AC
  Administered 2013-03-25: 10 meq
  Filled 2013-03-25: qty 100

## 2013-03-25 MED ORDER — HEPARIN SODIUM (PORCINE) 5000 UNIT/ML IJ SOLN
5000.0000 [IU] | Freq: Three times a day (TID) | INTRAMUSCULAR | Status: DC
  Administered 2013-03-25 – 2013-03-27 (×6): 5000 [IU] via SUBCUTANEOUS
  Filled 2013-03-25 (×9): qty 1

## 2013-03-25 MED ORDER — LORAZEPAM 2 MG/ML IJ SOLN
1.0000 mg | Freq: Four times a day (QID) | INTRAMUSCULAR | Status: DC | PRN
  Administered 2013-03-26: 1 mg via INTRAVENOUS
  Filled 2013-03-25 (×2): qty 1

## 2013-03-25 MED ORDER — OSELTAMIVIR PHOSPHATE 75 MG PO CAPS
75.0000 mg | ORAL_CAPSULE | Freq: Every day | ORAL | Status: DC
  Filled 2013-03-25 (×3): qty 1

## 2013-03-25 MED ORDER — VANCOMYCIN HCL IN DEXTROSE 750-5 MG/150ML-% IV SOLN
750.0000 mg | Freq: Two times a day (BID) | INTRAVENOUS | Status: DC
  Administered 2013-03-25 – 2013-03-28 (×6): 750 mg via INTRAVENOUS
  Filled 2013-03-25 (×7): qty 150

## 2013-03-25 MED ORDER — METHYLPREDNISOLONE SODIUM SUCC 125 MG IJ SOLR
125.0000 mg | Freq: Four times a day (QID) | INTRAMUSCULAR | Status: DC
  Administered 2013-03-25 – 2013-03-27 (×8): 125 mg via INTRAVENOUS
  Filled 2013-03-25 (×12): qty 2

## 2013-03-25 MED ORDER — POTASSIUM CHLORIDE 10 MEQ/100ML IV SOLN
10.0000 meq | INTRAVENOUS | Status: AC
  Administered 2013-03-25 (×3): 10 meq via INTRAVENOUS
  Filled 2013-03-25 (×3): qty 100

## 2013-03-25 MED ORDER — OSELTAMIVIR PHOSPHATE 75 MG PO CAPS
75.0000 mg | ORAL_CAPSULE | Freq: Every day | ORAL | Status: DC
  Filled 2013-03-25: qty 1

## 2013-03-25 MED ORDER — LEVOFLOXACIN IN D5W 750 MG/150ML IV SOLN
750.0000 mg | Freq: Every day | INTRAVENOUS | Status: DC
  Administered 2013-03-25 – 2013-03-26 (×3): 750 mg via INTRAVENOUS
  Filled 2013-03-25 (×4): qty 150

## 2013-03-25 MED ORDER — ALBUTEROL SULFATE (2.5 MG/3ML) 0.083% IN NEBU
2.5000 mg | INHALATION_SOLUTION | RESPIRATORY_TRACT | Status: DC | PRN
  Administered 2013-03-27: 2.5 mg via RESPIRATORY_TRACT
  Filled 2013-03-25: qty 3

## 2013-03-25 MED ORDER — INSULIN ASPART 100 UNIT/ML ~~LOC~~ SOLN
0.0000 [IU] | SUBCUTANEOUS | Status: DC
  Administered 2013-03-25 (×2): 3 [IU] via SUBCUTANEOUS
  Administered 2013-03-25 – 2013-03-26 (×2): 2 [IU] via SUBCUTANEOUS
  Administered 2013-03-26: 3 [IU] via SUBCUTANEOUS
  Administered 2013-03-26: 2 [IU] via SUBCUTANEOUS
  Administered 2013-03-26: 3 [IU] via SUBCUTANEOUS
  Administered 2013-03-26: 2 [IU] via SUBCUTANEOUS
  Administered 2013-03-26: 3 [IU] via SUBCUTANEOUS
  Administered 2013-03-27 (×4): 2 [IU] via SUBCUTANEOUS

## 2013-03-25 MED ORDER — PIPERACILLIN-TAZOBACTAM 3.375 G IVPB
3.3750 g | Freq: Three times a day (TID) | INTRAVENOUS | Status: DC
  Administered 2013-03-25 – 2013-03-29 (×13): 3.375 g via INTRAVENOUS
  Filled 2013-03-25 (×16): qty 50

## 2013-03-25 MED ORDER — LORAZEPAM 2 MG/ML IJ SOLN
0.5000 mg | Freq: Once | INTRAMUSCULAR | Status: AC
  Administered 2013-03-25: 0.5 mg via INTRAVENOUS
  Filled 2013-03-25: qty 1

## 2013-03-25 MED ORDER — ACETAMINOPHEN 650 MG RE SUPP
650.0000 mg | RECTAL | Status: DC | PRN
  Administered 2013-03-25: 650 mg via RECTAL
  Filled 2013-03-25: qty 1

## 2013-03-25 NOTE — Progress Notes (Signed)
ANTIBIOTIC CONSULT NOTE - INITIAL  Pharmacy Consult for levofloxacin Indication: COPD, Fever  Allergies  Allergen Reactions  . Colcrys [Colchicine] Itching  . Other Other (See Comments)    Medication-NapraPAC (prevacid and naprosyn) Reaction-Hiccups  . Prednisone Other (See Comments)    Hiccups    Patient Measurements: Height: '5\' 10"'  (177.8 cm) Weight: 176 lb 12.9 oz (80.2 kg) IBW/kg (Calculated) : 73 Adjusted Body Weight:   Vital Signs: Temp: 99.6 F (37.6 C) (03/07 0058) Temp src: Oral (03/07 0058) BP: 143/100 mmHg (03/06 2121) Pulse Rate: 129 (03/07 0058) Intake/Output from previous day: 03/06 0701 - 03/07 0700 In: 480 [P.O.:480] Out: 250 [Urine:250] Intake/Output from this shift: Total I/O In: 240 [P.O.:240] Out: 250 [Urine:250]  Labs:  Recent Labs  03/24/2013 2016 03/23/13 0328 03/24/13 0315  WBC 10.1 10.7*  --   HGB 12.6* 12.1*  --   PLT 188 167  --   CREATININE 1.42* 1.35 1.26   Estimated Creatinine Clearance: 49.9 ml/min (by C-G formula based on Cr of 1.26). No results found for this basename: VANCOTROUGH, VANCOPEAK, VANCORANDOM, GENTTROUGH, GENTPEAK, GENTRANDOM, TOBRATROUGH, TOBRAPEAK, TOBRARND, AMIKACINPEAK, AMIKACINTROU, AMIKACIN,  in the last 72 hours   Microbiology: No results found for this or any previous visit (from the past 720 hour(s)).  Medical History: Past Medical History  Diagnosis Date  . Hypertension   . Gout   . Hyperlipidemia   . Prostate cancer   . CAD (coronary artery disease) 2012    50-60% stenosia 3rd OM, 2nd diad of LAD 80% stenosis, EF 60%  . COPD (chronic obstructive pulmonary disease)     with home oxygen  . Anxiety and depression   . Hiatal hernia   . Pulmonary fibrosis 12/23/2011    Followed in Pulmonary clinic/ [facility]/ [facility]  - First suggested on cxr 12/2004 but clinically limiting activity since 2012 - PFT's 11/24/11  VC 2.94 and dllc 29%, FEV1 80%, ratio 74, no change w/ BD  - 12/23/2011  Walked RA x 2  laps @ 185 ft each stopped due to  desat to 86% -01/14/2012  Walked RA  2 laps @ 185 ft each stopped due to  desat to 87%  - ESR 72 12/23/11  > started steroids 1/23  . CKD (chronic kidney disease) 08/07/2011  . History of colon polyps   . Candida esophagitis   . H/O echocardiogram 2009    EF > 55% tr MR, tr TR  . H/O cardiovascular stress test 04/19/12    Low risk nuc. stress test    Medications:  Anti-infectives   Start     Dose/Rate Route Frequency Ordered Stop   03/25/13 0230  levofloxacin (LEVAQUIN) IVPB 750 mg     750 mg 100 mL/hr over 90 Minutes Intravenous Daily at bedtime 03/25/13 0215       Assessment: Patient with fever and COPD.  Goal of Therapy:  Levofloxacin dosed based on patient weight and renal function     Plan: Levofloxacin 755m iv q24hr   GNani SkillernCrowford 03/25/2013,2:54 AM

## 2013-03-25 NOTE — Progress Notes (Signed)
RN called into room by rounding NT who stated patient shivering uncontrollably.  Upon entering room, pt found to have O2 sats at 70% on 50% Venti mask.  Oxygen delivery system switched to nonrebreather mask.  O2 sats increased to 82%.  Patient continued to shiver uncontrollably, stating he felt cold despite being covered with numerous blankets.  Temp 98.5 oral.  Pulse 156.  MD notified and orders to transfer to step down unit obtained.  Patient transferred and report given at bedside to receiving RN.

## 2013-03-25 NOTE — Progress Notes (Signed)
ANTIBIOTIC CONSULT NOTE - INITIAL  Pharmacy Consult for vancomycin, Zosyn Indication: HCAP vs asp PNA  Allergies  Allergen Reactions  . Colcrys [Colchicine] Itching  . Other Other (See Comments)    Medication-NapraPAC (prevacid and naprosyn) Reaction-Hiccups  . Prednisone Other (See Comments)    Hiccups    Patient Measurements: Height: _0  (177.8 cm) Weight: 179 lb 3.7 oz (81.3 kg) IBW/kg (Calculated) : 73  Vital Signs: Temp: 102.6 F (39.2 C) (03/07 1200) Temp src: Axillary (03/07 1200) BP: 125/75 mmHg (03/07 0545) Pulse Rate: 122 (03/07 0545) Intake/Output from previous day: 03/06 0701 - 03/07 0700 In: 789 [P.O.:480; I.V.:159; IV Piggyback:150] Out: 250 [Urine:250] Intake/Output from this shift:    Labs:  Recent Labs  04/08/2013 2016 03/23/13 0328 03/24/13 0315 03/25/13 0640  WBC 10.1 10.7*  --  11.9*  HGB 12.6* 12.1*  --  12.4*  PLT 188 167  --  170  CREATININE 1.42* 1.35 1.26 1.35   Estimated Creatinine Clearance: 46.6 ml/min (by C-G formula based on Cr of 1.35). No results found for this basename: VANCOTROUGH, VANCOPEAK, VANCORANDOM, GENTTROUGH, GENTPEAK, GENTRANDOM, TOBRATROUGH, TOBRAPEAK, TOBRARND, AMIKACINPEAK, AMIKACINTROU, AMIKACIN,  in the last 72 hours   Microbiology: No results found for this or any previous visit (from the past 720 hour(s)).  Medical History: Past Medical History  Diagnosis Date  . Hypertension   . Gout   . Hyperlipidemia   . Prostate cancer   . CAD (coronary artery disease) 2012    50-60% stenosia 3rd OM, 2nd diad of LAD 80% stenosis, EF 60%  . COPD (chronic obstructive pulmonary disease)     with home oxygen  . Anxiety and depression   . Hiatal hernia   . Pulmonary fibrosis 12/23/2011    Followed in Pulmonary clinic/ [facility]/ [facility]  - First suggested on cxr 12/2004 but clinically limiting activity since 2012 - PFT's 11/24/11  VC 2.94 and dllc 29%, FEV1 80%, ratio 74, no change w/ BD  - 12/23/2011  Walked RA x 2  laps @ 185 ft each stopped due to  desat to 86% -01/14/2012  Walked RA  2 laps @ 185 ft each stopped due to  desat to 87%  - ESR 72 12/23/11  > started steroids 1/23  . CKD (chronic kidney disease) 08/07/2011  . History of colon polyps   . Candida esophagitis   . H/O echocardiogram 2009    EF > 55% tr MR, tr TR  . H/O cardiovascular stress test 04/19/12    Low risk nuc. stress test    Medications:  Scheduled:  . ketotifen  1 drop Both Eyes BID  . levofloxacin (LEVAQUIN) IV  750 mg Intravenous QHS  . methylPREDNISolone (SOLU-MEDROL) injection  125 mg Intravenous Q6H  . oseltamivir  75 mg Oral Daily  . sodium chloride  3 mL Intravenous Q12H   Infusions:    Assessment: 80 yo male known to pharmacy already after dosing Levaquin for AECOPD now to start vancomycin and Zosyn per pharmacy dosing for HCAP vs asp PNA.   Tmax 102.7  WBC 11  SCr 1.35, CrCl 46  Goal of Therapy:  Vancomycin trough level 15-20 mcg/ml  Plan:  1) Vancomycin 773m IV q12 2) Zosyn 3.375g IV q8 (extended interval infusion)   JAdrian Saran PharmD, BCPS Pager [phone] 48353/07/2013 12:23 PM

## 2013-03-25 NOTE — Consult Note (Addendum)
PULMONARY / CRITICAL CARE MEDICINE  Name: James Pope MRN: [id number] DOB: [birth year]    ADMISSION DATE:  04/03/2013 CONSULTATION DATE:  03/25/2013  REFERRING MD :  [facility] PRIMARY SERVICE:  TR[facility]  CHIEF COMPLAINT:  Hypoxemia  BRIEF PATIENT DESCRIPTION: 80 yo with past medical history of pulmonary fibrosis on home oxygen brought to ED on 3/4 and admitted with hypoxia. On 3/7 developed acute respiratory distress and was transferred to ICU.  SIGNIFICANT EVENTS / STUDIES:  3/4  Admitted with progressive hypoxia 3/5  TTE >>> EF 65%, normal RV size and function 3/7  Transferred to ICU, DNR/DNI established, BiPAP  LINES / TUBES:  CULTURES: 3/6 Blood >>> 3/7 Urine >>> 3/7 RVP >>>  ANTIBIOTICS: Zosyn 3/7 >>> Vancomycin 3/7 >>> Levaquin 3/7 >>>  The patient is in acute distress and unable to provide history, which was obtained for available medical records.  HISTORY OF PRESENT ILLNESS:  80 yo with past medical history of pulmonary fibrosis on home oxygen brought to ED on 3/4 and admitted with hypoxia. On 3/7 developed acute respiratory distress and was transferred to ICU.  PAST MEDICAL HISTORY :  Past Medical History  Diagnosis Date  . Hypertension   . Gout   . Hyperlipidemia   . Prostate cancer   . CAD (coronary artery disease) 2012    50-60% stenosia 3rd OM, 2nd diad of LAD 80% stenosis, EF 60%  . COPD (chronic obstructive pulmonary disease)     with home oxygen  . Anxiety and depression   . Hiatal hernia   . Pulmonary fibrosis 12/23/2011    Followed in Pulmonary clinic/ [facility]/ [facility]  - First suggested on cxr 12/2004 but clinically limiting activity since 2012 - PFT's 11/24/11  VC 2.94 and dllc 29%, FEV1 80%, ratio 74, no change w/ BD  - 12/23/2011  Walked RA x 2 laps @ 185 ft each stopped due to  desat to 86% -01/14/2012  Walked RA  2 laps @ 185 ft each stopped due to  desat to 87%  - ESR 72 12/23/11  > started steroids 1/23  . CKD (chronic kidney disease) 08/07/2011   . History of colon polyps   . Candida esophagitis   . H/O echocardiogram 2009    EF > 55% tr MR, tr TR  . H/O cardiovascular stress test 04/19/12    Low risk nuc. stress test   Past Surgical History  Procedure Laterality Date  . Cardiac catheterization  04/04/2010    80% stenosis of sm 2nd diag, 60-605 3rd OM, EF 60 %  . Skin graft      rt arm  . Colonoscopy      x 2- Dr. Oletta Lamas   Prior to Admission medications   Medication Sig Start Date End Date Taking? Authorizing Provider  aspirin EC 81 MG tablet Take 81 mg by mouth every morning.    Yes Historical Provider, MD  azithromycin (ZITHROMAX) 250 MG tablet Take 250 mg by mouth daily. 03/20/13  Yes Historical Provider, MD  Biotin (SUPER BIOTIN) 5 MG TABS Take 1 tablet by mouth daily.   Yes Historical Provider, MD  calcium carbonate (TUMS - DOSED IN MG ELEMENTAL CALCIUM) 500 MG chewable tablet Per bottle as needed for heartburn   Yes Historical Provider, MD  Calcium Carbonate-Vitamin D (CALCIUM 600 + D PO) Take 1 tablet by mouth at bedtime.   Yes Historical Provider, MD  dextromethorphan-guaiFENesin (MUCINEX DM) 30-600 MG per 12 hr tablet Take 1 tablet by mouth 2 (  two) times daily as needed.    Yes Historical Provider, MD  diltiazem (CARDIZEM) 30 MG tablet Take 1 tablet (30 mg total) by mouth 2 (two) times daily. Appointment needed for refills. 02/25/13  Yes Troy Sine, MD  famotidine (PEPCID) 20 MG tablet Take 20 mg by mouth at bedtime.  12/23/11  Yes Tanda Rockers, MD  febuxostat (ULORIC) 40 MG tablet Take 40 mg by mouth daily.   Yes Historical Provider, MD  furosemide (LASIX) 20 MG tablet Take 20 mg by mouth daily. 09/22/12  Yes Tanda Rockers, MD  ketotifen (ZADITOR) 0.025 % ophthalmic solution Place 1 drop into both eyes 2 (two) times daily.   Yes Historical Provider, MD  methylPREDNISolone (MEDROL) 16 MG tablet Take 8 mg by mouth every morning.  06/28/12  Yes Tanda Rockers, MD  Multiple Vitamins-Minerals (MULTIVITAMINS THER. W/MINERALS)  TABS Take 1 tablet by mouth every morning.    Yes Historical Provider, MD  Olopatadine HCl (PATADAY OP) Apply 1 drop to eye 2 (two) times daily.   Yes Historical Provider, MD  OXYGEN-HELIUM IN Inhale 3 L into the lungs continuous.    Yes Historical Provider, MD  pantoprazole (PROTONIX) 20 MG tablet Take 40 mg by mouth daily.   Yes Historical Provider, MD  Polyethyl Glycol-Propyl Glycol (SYSTANE OP) Apply 1-2 drops to eye as needed (dry eyes.).   Yes Historical Provider, MD  simethicone (MYLICON) 132 MG chewable tablet Chew by mouth. Per bottle as needed for gas/bloating   Yes Historical Provider, MD  Tamsulosin HCl (FLOMAX) 0.4 MG CAPS Take 0.8 mg by mouth at bedtime.    Yes Historical Provider, MD   Allergies  Allergen Reactions  . Colcrys [Colchicine] Itching  . Other Other (See Comments)    Medication-NapraPAC (prevacid and naprosyn) Reaction-Hiccups  . Prednisone Other (See Comments)    Hiccups   FAMILY HISTORY:  Family History  Problem Relation Age of Onset  . Diabetes type II Father   . Prostate cancer Father   . Prostate cancer Brother   . Throat cancer Brother     smoked  . Heart disease Father    SOCIAL HISTORY:  reports that he quit smoking about 15 years ago. His smoking use included Cigarettes. He has a 40 pack-year smoking history. He has never used smokeless tobacco. He reports that he does not drink alcohol or use illicit drugs.  REVIEW OF SYSTEMS:  Unable to provide.  INTERVAL HISTORY:  VITAL SIGNS: Temp:  [98 F (36.7 C)-102.7 F (39.3 C)] 98 F (36.7 C) (03/07 0809) Pulse Rate:  [117-130] 122 (03/07 0545) Resp:  [16-28] 25 (03/07 0545) BP: (103-143)/(62-100) 125/75 mmHg (03/07 0545) SpO2:  [87 %-100 %] 91 % (03/07 0823) FiO2 (%):  [50 %-55 %] 55 % (03/07 0823) Weight:  [81.3 kg (179 lb 3.7 oz)] 81.3 kg (179 lb 3.7 oz) (03/07 0545) HEMODYNAMICS:   VENTILATOR SETTINGS: Vent Mode:  [-]  FiO2 (%):  [50 %-55 %] 55 % INTAKE / OUTPUT: Intake/Output      03/06 0701 - 03/07 0700 03/07 0701 - 03/08 0700   P.O. 480    I.V. (mL/kg) 159 (2)    IV Piggyback 150    Total Intake(mL/kg) 789 (9.7)    Urine (mL/kg/hr) 250 (0.1)    Total Output 250     Net +539          Urine Occurrence 2 x    Stool Occurrence 2 x      PHYSICAL  EXAMINATION: General:  Appears acutely ill, in respiratory distress Neuro:  Encephalopathic / confused HEENT:  PERRL, dry membranes Cardiovascular:  Tachycardic, regular Lungs:  Bilateral diminished air entry, rales Abdomen:  Soft, nontender, bowel sounds diminished Musculoskeletal:  Moves all extremities, no edema Skin:  Intact  LABS: CBC  Recent Labs Lab 04/07/2013 2016 03/23/13 0328 03/25/13 0640  WBC 10.1 10.7* 11.9*  HGB 12.6* 12.1* 12.4*  HCT 37.1* 36.0* 36.0*  PLT 188 167 170   Coag's No results found for this basename: APTT, INR,  in the last 168 hours BMET  Recent Labs Lab 03/23/13 0328 03/24/13 0315 03/25/13 0640  NA 144 140 135*  K 3.1* 3.8 3.5*  CL 101 98 95*  CO2 '23 28 26  ' BUN '19 21 19  ' CREATININE 1.35 1.26 1.35  GLUCOSE 189* 113* 131*   Electrolytes  Recent Labs Lab 03/23/13 0328 03/24/13 0315 03/25/13 0640  CALCIUM 9.1 9.4 9.1   Sepsis Markers No results found for this basename: LATICACIDVEN, PROCALCITON, O2SATVEN,  in the last 168 hours ABG  Recent Labs Lab 03/25/13 0531 03/25/13 1130  PHART 7.487* 7.490*  PCO2ART 36.1 32.9*  PO2ART 112.0* 42.0*   Liver Enzymes  Recent Labs Lab 03/25/13 0640  AST 28  ALT 11  ALKPHOS 63  BILITOT 1.3*  ALBUMIN 2.7*   Cardiac Enzymes  Recent Labs Lab 04/04/2013 2016 03/23/13 0328  TROPONINI <0.30  --   PROBNP 134.2 123.8   Glucose No results found for this basename: GLUCAP,  in the last 168 hours  IMAGING:  Dg Chest Port 1 View  03/24/2013   CLINICAL DATA:  Fever and shortness of breath  EXAM: PORTABLE CHEST - 1 VIEW  COMPARISON:  03/23/2013  FINDINGS: Cardiac shadow is stable. The lungs are well aerated  bilaterally. Diffuse interstitial changes are again identified. No definitive acute infiltrate is seen. No sizable effusion is noted. No bony abnormality is seen.  IMPRESSION: Chronic changes without acute abnormality.   Electronically Signed   By: Inez Catalina M.D.   On: 03/24/2013 22:02   ASSESSMENT / PLAN:  PULMONARY A:   Acute hypoxemic respiratory failure Pulmonary fibrosis on oxygen, possible accelerated now Suspected pneumonia No evidence of acute bronchospasm Must rule out acute pulmonary embolism P:   Goal SpO2>92 Supplemental oxygen Trend ABG / CXR Albuterol PRN Solu-Medrol 125 mg q6h Chest CTA BiPAP DNI  CARDIOVASCULAR A:  Tachycardia in setting of acute hypoxemia P:  Trend troponin 12 lead EKG DNR  RENAL A:   Hypokalemia Eu to hypovolemic P:   K 10 x 4 NS'@100'   GASTROINTESTINAL A:   Nutrition GI Px is not indicated P:   NPO  HEMATOLOGIC A:   Mild anemia VTE Px P:  Trend CBC Heparin for DVT Px  INFECTIOUS A:   Suspected pneumonia / flu P:   Cultures and antibiotics as above PCT  ENDOCRINE  A:   At risk for steroid induced hyperglycemia  P:   SSI  NEUROLOGIC A:  Acute encephalopathy in setting of hypoxemia / infection P:   Monitor  Son / wife updated at bedside.  DNR in case of cardiac arrest.  DNI.  Otherwise full care for now.  I have personally obtained history, examined patient, evaluated and interpreted laboratory and imaging results, reviewed medical records, formulated assessment / plan and placed orders.  CRITICAL CARE:  The patient is critically ill with multiple organ systems failure and requires high complexity decision making for assessment and support, frequent evaluation and  titration of therapies, application of advanced monitoring technologies and extensive interpretation of multiple databases. Critical Care Time devoted to patient care services described in this note is 45 minutes.   Doree Fudge,  MD Pulmonary and [place] Pager: [phone]  03/25/2013, 12:09 PM

## 2013-03-25 NOTE — Evaluation (Signed)
Physical Therapy Evaluation- Late entry from 03/23/13 Patient Details Name: James Pope MRN: [id number] DOB: 12-15-1932 Today's Date: 03/25/2013 Time:  - [id number]    PT Assessment / Plan / Recommendation History of Present Illness  yo male with h/o chronic resp failure on 2 L oxygen at home cont, pulm fibrosis, htn, ckd cad, comes in with over one week of more doe and feeling like he is gaining wt in his legs and abdomen girth.  Denies fevers.  no worse cough than usual.  No n/v/d.  Taking lasix 20mg  po daily but doesn't feel like it makes him urinate like it use to, has been on that dose for over a year.  He had a uri about 3 weeks ago and was treated with unk abx, and also just hasnt felt back to his normal since then.  No chest pain.  No pnd or orlthopnea.  Today a friend came to ring his doorbell, he got up from the sofa, walked to the door, and walked back to the sofa.  By the time he got back to the sofa, he got very weak and sob.  This improved with rest.  When ems got there his oxygen sats were in the mid 70s on his 2 Liters, this increased with increasing his oxygen demands.  His other vitals were normal.  Clinical Impression  Pt tolerated ambulation x 60 '. Pt will benfit from pt to address problems. Pt had been attending Pulmonary rehab recently.    PT Assessment  Patient needs continued PT services    Follow Up Recommendations   (pulmonary rehab)    Does the patient have the potential to tolerate intense rehabilitation      Barriers to Discharge        Equipment Recommendations  None recommended by PT    Recommendations for Other Services     Frequency Min 3X/week    Precautions / Restrictions Precautions Precaution Comments: on O2 Restrictions Weight Bearing Restrictions: No   Pertinent Vitals/Pain sats 85% on 3 l with walking.      Mobility  Bed Mobility Overal bed mobility: Independent Transfers Overall transfer level:  Independent Ambulation/Gait Ambulation/Gait assistance: Supervision Ambulation Distance (Feet): 60 Feet Assistive device: None Gait Pattern/deviations: WFL(Within Functional Limits)    Exercises     PT Diagnosis: Difficulty walking  PT Problem List: Decreased activity tolerance PT Treatment Interventions: Gait training;Stair training     PT Goals(Current goals can be found in the care plan section) Acute Rehab PT Goals Patient Stated Goal: to go back to pulmonary rehab PT Goal Formulation: With patient Time For Goal Achievement: 04/08/13 Potential to Achieve Goals: Good  Visit Information  Last PT Received On: 03/23/13 Assistance Needed: +1 Reason Eval/Treat Not Completed: Medical issues which prohibited therapy (resp distress, fever) History of Present Illness: yo male with h/o chronic resp failure on 2 L oxygen at home cont, pulm fibrosis, htn, ckd cad, comes in with over one week of more doe and feeling like he is gaining wt in his legs and abdomen girth.  Denies fevers.  no worse cough than usual.  No n/v/d.  Taking lasix 20mg  po daily but doesn't feel like it makes him urinate like it use to, has been on that dose for over a year.  He had a uri about 3 weeks ago and was treated with unk abx, and also just hasnt felt back to his normal since then.  No chest pain.  No pnd or orlthopnea.  Today  a friend came to ring his doorbell, he got up from the sofa, walked to the door, and walked back to the sofa.  By the time he got back to the sofa, he got very weak and sob.  This improved with rest.  When ems got there his oxygen sats were in the mid 70s on his 2 Liters, this increased with increasing his oxygen demands.  His other vitals were normal.       Prior [place] expects to be discharged to:: Private residence Living Arrangements: Spouse/significant other Available Help at Discharge: Family Type of Home: House Home Access: Stairs to enter Engineer, site of Steps: 4 Entrance Stairs-Rails: Right;Left Home Layout: One level [place]: None Prior Function Level of Independence: Independent    Cognition  Cognition Arousal/Alertness: Awake/alert Behavior During Therapy: WFL for tasks assessed/performed    Extremity/Trunk Assessment Upper Extremity Assessment Upper Extremity Assessment: Overall WFL for tasks assessed Lower Extremity Assessment Lower Extremity Assessment: Overall WFL for tasks assessed   Balance    End of Session PT - End of Session Activity Tolerance: Patient tolerated treatment well Patient left: in bed Nurse Communication: Mobility status  GP     Claretha Cooper 03/25/13- late entry from 03/23/13

## 2013-03-25 NOTE — Progress Notes (Signed)
Called to room 1310 at 0500 for pt in respiratory distress and high fever. Upon my arrival to the room at 0515 pt found resting in bed awake alert, denies pain, complains of mild sob and appears comfortable. 02 sats 98-100% on NRB, RR 25-28. Lung sounds clear to assessment. Pt with chills, axillary temp 102.7. Cooling measures initiated with removal of blankets, and fan to room. Jonette Eva NP for [facility] paged upon my arrival, ABG to be done per RT due to patients increased 02 need. Per pt he has had his flu vaccine this season, however the flu has not been ruled out this admit. Flu swab obtained per floor RN. ABG results obtained 0545 with no critical values. Pt placed on respiratory isolation until flu swab returns. Floor RN encouraged to maintain pt 02 sat at goal of greater than 92%, pt changed to VM 55% at 0600. RN will recheck axillary temp in 30 minutes to assess progress. Am labs ordered per Jonette Eva. Floor RN to monitor closely and notify me and/or provider of any worsening or changes.

## 2013-03-25 NOTE — Progress Notes (Signed)
Pt sats on 6LPM [place] 87%. RT placed pt on 55% VM, RN aware. Pt has been on device through out the night.

## 2013-03-25 NOTE — Progress Notes (Signed)
PT Cancellation Note  Patient Details Name: James Pope MRN: [id number] DOB: [birth year]   Cancelled Treatment:    Reason Eval/Treat Not Completed: Medical issues which prohibited therapy (resp distress, fever)   Claretha Cooper 03/25/2013, 7:12 AM

## 2013-03-25 NOTE — Progress Notes (Signed)
Patient last night continued to spike fevers greater than 102.  MD was paged.  Blood cultures, urine cultures, and f/o chest xray were obtained. IV levaquin was started. Throughout the night patient also continued to desat.  Respiratory Therapy was notified and patient was switched from a nasal cannula to a venti and then a non-rebreather.  Rapid response RN was notified this am aswell as MD.  More orders were placed for labs, ABG, flu swab, etc.  Patient was given tylenol.  Cooling measures were taken such as ice bags, and a fan.  Fever is slowly decreasing with tylenol.  He is now back on a venti mask with 02 sats in the low 90's.  Day shift RN made aware of issues during the night.  Patient very appreciative of all measures taken.  Will continue to monitor.James Pope

## 2013-03-25 NOTE — Progress Notes (Signed)
[facility] PROGRESS NOTE  ARGENIS KUMARI GMW:[id number] DOB: 1932/11/17 DOA: 04/02/2013 PCP: Foye Spurling, MD  Brief narrative: 80 yo male with h/o chronic resp failure secondary to pulmonary fibrosis, oxygen dependent, on 2 L oxygen at home, COPD, CKD who presented to [facility] ED 03/21/2013 with complaints of lower extremity swelling and weight gain for past one week prior to this admission. He reports shortness of breath but no chest pain or palpitations or orthopnea. On admission, his oxygen saturation was in need of 78 on 2 L nasal cannula oxygen support. Chest x-ray on admission showed no acute cardiopulmonary disease but stable chronic interstitial disease likely pulmonary fibrosis. This morning pt acutely decompensated and was very short of breath requiring ventimask above 50% to keep O2 saturation above 90%. Consult made to PCCM and order was placed to transfer to SDU.  Assessment/Plan:   Principal Problem:  Acute respiratory failure with hypoxia  - Likely secondary to combination of pulmonary fibrosis and COPD versus CHF  - repeated CXR on 3/6 which did not show significant changes from CXR on admission - requires Ventimask to keep O2 saturation above 90% - added antibiotics for possible HCAP, sepsis (vanco, zosyn started 3/7, Levaquin started 3/6); added tamiflu until influenza panel results reported  - solumedrol 125 mg IV Q 6 hours  - Continue Albuterol every 2 hours PRN  Active Problems:  Probable HCAP, influenza - as mentioned above: vanco, zosyn, Levaquin and tamiflu Hypokalemia  - Secondary to Lasix  - Repleted again this am Acute renal failure  - Likely secondary to dehydration  - resolved with IV fluids  Hypertension  - hold Cardizem, BP 125/75 BPH  - hold flomax for now; monitor BP  Code Status: DNR/DNI Family Communication: no family at the bedside  Disposition Plan: transfer to SDU  Consultants:  PCCM Procedures:  None  Antibiotics:  Levaquin 03/24/2013  --> Zosyn 03/25/2013 --> Vancomycin 03/25/2013 --> Oseltamivir 03/25/2013 -->   Leisa Lenz, MD  [facility] Pager [phone]  If 7PM-7AM, please contact night-coverage [email or web address] Password [email or web address] 03/25/2013, 1:11 PM   LOS: 3 days    HPI/Subjective: In moderate distress this am, shaking, shortness of breath.  Objective: Filed Vitals:   03/25/13 0809 03/25/13 0823 03/25/13 1200 03/25/13 1220  BP:      Pulse:    127  Temp: 98 F (36.7 C)  102.6 F (39.2 C)   TempSrc: Oral  Axillary   Resp:    32  Height:      Weight:      SpO2:  91%  100%    Intake/Output Summary (Last 24 hours) at 03/25/13 1311 Last data filed at 03/25/13 1200  Gross per 24 hour  Intake    549 ml  Output    400 ml  Net    149 ml    Exam:   General:  Pt is in distress, shaking and requires venti mask to keep O2 sat above 90%  Cardiovascular: tachycardic,  S1/S2 appreciated  Respiratory: diminished with coarse breath sounds   Abdomen: Soft, non tender, non distended, bowel sounds present, no guarding  Extremities: No edema, pulses DP and PT palpable bilaterally  Neuro: Grossly nonfocal  Data Reviewed: Basic Metabolic Panel:  Recent Labs Lab 04/18/2013 2016 03/23/13 0328 03/24/13 0315 03/25/13 0640  NA 142 144 140 135*  K 3.6* 3.1* 3.8 3.5*  CL 100 101 98 95*  CO2 23 23 28 26   GLUCOSE 142* 189* 113* 131*  BUN 17 19 21  19  CREATININE 1.42* 1.35 1.26 1.35  CALCIUM 9.7 9.1 9.4 9.1   Liver Function Tests:  Recent Labs Lab 03/25/13 0640  AST 28  ALT 11  ALKPHOS 63  BILITOT 1.3*  PROT 6.8  ALBUMIN 2.7*   No results found for this basename: LIPASE, AMYLASE,  in the last 168 hours No results found for this basename: AMMONIA,  in the last 168 hours CBC:  Recent Labs Lab 04/15/2013 2016 03/23/13 0328 03/25/13 0640  WBC 10.1 10.7* 11.9*  NEUTROABS  --   --  9.7*  HGB 12.6* 12.1* 12.4*  HCT 37.1* 36.0* 36.0*  MCV 93.5 94.0 92.8  PLT 188 167 170   Cardiac  Enzymes:  Recent Labs Lab 03/20/2013 2016  TROPONINI <0.30   BNP: No components found with this basename: POCBNP,  CBG: No results found for this basename: GLUCAP,  in the last 168 hours  MRSA PCR SCREENING     Status: None   Collection Time    03/25/13 11:45 AM      Result Value Ref Range Status   MRSA by PCR NEGATIVE  NEGATIVE Final     Studies: Dg Chest Port 1 View  03/24/2013   CLINICAL DATA:  Fever and shortness of breath  EXAM: PORTABLE CHEST - 1 VIEW  COMPARISON:  03/23/2013  FINDINGS: Cardiac shadow is stable. The lungs are well aerated bilaterally. Diffuse interstitial changes are again identified. No definitive acute infiltrate is seen. No sizable effusion is noted. No bony abnormality is seen.  IMPRESSION: Chronic changes without acute abnormality.   Electronically Signed   By: Inez Catalina M.D.   On: 03/24/2013 22:02    Scheduled Meds: . heparin subcutaneous  5,000 Units Subcutaneous 3 times per day  . insulin aspart  0-15 Units Subcutaneous 6 times per day  . ketotifen  1 drop Both Eyes BID  . levofloxacin (LEVAQUIN)   750 mg Intravenous QHS  . methylPREDNISolone (SOLU-MEDROL) injection  125 mg Intravenous Q6H  . oseltamivir  75 mg Oral Daily  . piperacillin-tazobactam (ZOSYN)  IV  3.375 g Intravenous Q8H  . potassium chloride  10 mEq Intravenous Q1 Hr x 4  . vancomycin  750 mg Intravenous Q12H   Continuous Infusions: . sodium chloride

## 2013-03-26 ENCOUNTER — Inpatient Hospital Stay (HOSPITAL_COMMUNITY): Payer: Medicare Other

## 2013-03-26 DIAGNOSIS — I498 Other specified cardiac arrhythmias: Secondary | ICD-10-CM

## 2013-03-26 LAB — CBC
HCT: 37.4 % — ABNORMAL LOW (ref 39.0–52.0)
Hemoglobin: 12.4 g/dL — ABNORMAL LOW (ref 13.0–17.0)
MCH: 31.5 pg (ref 26.0–34.0)
MCHC: 33.2 g/dL (ref 30.0–36.0)
MCV: 94.9 fL (ref 78.0–100.0)
Platelets: 160 10*3/uL (ref 150–400)
RBC: 3.94 MIL/uL — AB (ref 4.22–5.81)
RDW: 14.1 % (ref 11.5–15.5)
WBC: 8.2 10*3/uL (ref 4.0–10.5)

## 2013-03-26 LAB — BASIC METABOLIC PANEL
BUN: 19 mg/dL (ref 6–23)
CHLORIDE: 96 meq/L (ref 96–112)
CO2: 27 mEq/L (ref 19–32)
CREATININE: 1.35 mg/dL (ref 0.50–1.35)
Calcium: 9.5 mg/dL (ref 8.4–10.5)
GFR calc non Af Amer: 49 mL/min — ABNORMAL LOW (ref >90)
GFR, EST AFRICAN AMERICAN: 56 mL/min — AB (ref >90)
GLUCOSE: 182 mg/dL — AB (ref 70–99)
Potassium: 5.3 mEq/L (ref 3.7–5.3)
Sodium: 135 mEq/L — ABNORMAL LOW (ref 137–147)

## 2013-03-26 LAB — PROCALCITONIN: PROCALCITONIN: 0.42 ng/mL

## 2013-03-26 LAB — GLUCOSE, CAPILLARY
GLUCOSE-CAPILLARY: 115 mg/dL — AB (ref 70–99)
GLUCOSE-CAPILLARY: 140 mg/dL — AB (ref 70–99)
GLUCOSE-CAPILLARY: 161 mg/dL — AB (ref 70–99)
Glucose-Capillary: 131 mg/dL — ABNORMAL HIGH (ref 70–99)
Glucose-Capillary: 137 mg/dL — ABNORMAL HIGH (ref 70–99)
Glucose-Capillary: 168 mg/dL — ABNORMAL HIGH (ref 70–99)
Glucose-Capillary: 174 mg/dL — ABNORMAL HIGH (ref 70–99)

## 2013-03-26 LAB — RESPIRATORY VIRUS PANEL
Adenovirus: NOT DETECTED
INFLUENZA A: NOT DETECTED
INFLUENZA B 1: NOT DETECTED
Influenza A H1: NOT DETECTED
Influenza A H3: NOT DETECTED
Metapneumovirus: NOT DETECTED
PARAINFLUENZA 1 A: NOT DETECTED
PARAINFLUENZA 2 A: NOT DETECTED
PARAINFLUENZA 3 A: NOT DETECTED
RESPIRATORY SYNCYTIAL VIRUS B: NOT DETECTED
Respiratory Syncytial Virus A: NOT DETECTED
Rhinovirus: NOT DETECTED

## 2013-03-26 LAB — TROPONIN I: Troponin I: <0.3 ng/mL (ref <0.30)

## 2013-03-26 MED ORDER — TAMSULOSIN HCL 0.4 MG PO CAPS
0.8000 mg | ORAL_CAPSULE | Freq: Every day | ORAL | Status: DC
  Administered 2013-03-26 – 2013-03-28 (×3): 0.8 mg via ORAL
  Filled 2013-03-26 (×5): qty 2

## 2013-03-26 NOTE — Progress Notes (Addendum)
PULMONARY / CRITICAL CARE MEDICINE  Name: James Pope MRN: [id number] DOB: 1932/09/30    ADMISSION DATE:  04/01/2013 CONSULTATION DATE:  03/25/2013  REFERRING MD :  [facility] PRIMARY SERVICE:  [facility]  CHIEF COMPLAINT:  Hypoxemia  BRIEF PATIENT DESCRIPTION: 80 yo with past medical history of pulmonary fibrosis on home oxygen brought to ED on 3/4 and admitted with hypoxia. On 3/7 developed acute respiratory distress and was transferred to ICU.  SIGNIFICANT EVENTS / STUDIES:  3/4  Admitted with progressive hypoxia 3/5  TTE >>> EF 65%, normal RV size and function 3/7  Transferred to ICU, DNR/DNI established, BiPAP 3/7  Chest CT >>> Emphysema, ILD ( worse appearance vs superimposed interstitial pneumonia )  LINES / TUBES: Foley 3/8 >>>  CULTURES: 3/6 Blood >>> 3/7 Urine >>> ESCHERICHIA COLI ( 20K) 3/7 RVP >>> neg  ANTIBIOTICS: Zosyn 3/7 >>> Vancomycin 3/7 >>> Levaquin 3/7 >>>  INTERVAL HISTORY:  Improved overnight.  Urine retention.  VITAL SIGNS: Temp:  [97.5 F (36.4 C)-102.6 F (39.2 C)] 97.7 F (36.5 C) (03/08 0800) Pulse Rate:  [73-127] 73 (03/08 0800) Resp:  [14-35] 21 (03/08 0800) BP: (104-160)/(59-106) 113/80 mmHg (03/08 0800) SpO2:  [72 %-100 %] 98 % (03/08 0835) FiO2 (%):  [50 %-100 %] 100 % (03/08 0835) Weight:  [78.3 kg (172 lb 9.9 oz)-83.2 kg (183 lb 6.8 oz)] 83.2 kg (183 lb 6.8 oz) (03/08 0000)  HEMODYNAMICS:   VENTILATOR SETTINGS: Vent Mode:  [-] Other (Comment) FiO2 (%):  [50 %-100 %] 100 % Set Rate:  [15 bmp] 15 bmp PEEP:  [5 cmH20] 5 cmH20 Pressure Support:  [6 cmH20] 6 cmH20 INTAKE / OUTPUT: Intake/Output     03/07 0701 - 03/08 0700 03/08 0701 - 03/09 0700   P.O.     I.V. (mL/kg) 1640 (19.7) 200 (2.4)   IV Piggyback 762.5    Total Intake(mL/kg) 2402.5 (28.9) 200 (2.4)   Urine (mL/kg/hr) 750 (0.4) 150 (0.5)   Total Output 750 150   Net +1652.5 +50          PHYSICAL EXAMINATION: General:  Appears acutely ill, no distress Neuro:  Awake,  appropriate HEENT:  PERRL, dry membranes Cardiovascular:  Regular, no murmurs Lungs:  Bilateral diminished air entry, rales Abdomen:  Soft, nontender, bowel sounds diminished Musculoskeletal:  Moves all extremities, no edema Skin:  Intact  LABS: CBC  Recent Labs Lab 03/23/13 0328 03/25/13 0640 03/26/13 0002  WBC 10.7* 11.9* 8.2  HGB 12.1* 12.4* 12.4*  HCT 36.0* 36.0* 37.4*  PLT 167 170 160   Coag's No results found for this basename: APTT, INR,  in the last 168 hours BMET  Recent Labs Lab 03/24/13 0315 03/25/13 0640 03/26/13 0002  NA 140 135* 135*  K 3.8 3.5* 5.3  CL 98 95* 96  CO2 28 26 27   BUN 21 19 19   CREATININE 1.26 1.35 1.35  GLUCOSE 113* 131* 182*   Electrolytes  Recent Labs Lab 03/24/13 0315 03/25/13 0640 03/26/13 0002  CALCIUM 9.4 9.1 9.5   Sepsis Markers  Recent Labs Lab 03/25/13 0647 03/26/13 0002  PROCALCITON 0.18 0.42   ABG  Recent Labs Lab 03/25/13 0531 03/25/13 1130  PHART 7.487* 7.490*  PCO2ART 36.1 32.9*  PO2ART 112.0* 42.0*   Liver Enzymes  Recent Labs Lab 03/25/13 0640  AST 28  ALT 11  ALKPHOS 63  BILITOT 1.3*  ALBUMIN 2.7*   Cardiac Enzymes  Recent Labs Lab 04/12/2013 2016 03/23/13 0328 03/25/13 1327 03/25/13 1827 03/26/13 0002  TROPONINI <0.30  --  <0.30 <0.30 <0.30  PROBNP 134.2 123.8  --   --   --    Glucose  Recent Labs Lab 03/25/13 1246 03/25/13 1730 03/25/13 2051 03/26/13 0014 03/26/13 0350 03/26/13 0635  GLUCAP 123* 165* 175* 168* 137* 115*   IMAGING:  Ct Chest Wo Contrast  03/25/2013   CLINICAL DATA:  Hypoxia.  Known history of pulmonary fibrosis.  EXAM: CT CHEST WITHOUT CONTRAST  TECHNIQUE: Multidetector CT imaging of the chest was performed following the standard protocol without IV contrast.  COMPARISON:  Chest CT 07/08/2012.  FINDINGS: Mediastinum: Heart size is normal. There is no significant pericardial fluid, thickening or pericardial calcification. There is atherosclerosis of the  thoracic aorta, the great vessels of the mediastinum and the coronary arteries, including calcified atherosclerotic plaque in the left main, left anterior descending, left circumflex and right coronary arteries. Numerous borderline enlarged mediastinal and hilar lymph nodes, similar to the prior examination, presumably reactive in the setting of chronic interstitial lung disease. Esophagus is unremarkable in appearance.  Lungs/Pleura: There is a background of moderate centrilobular and paraseptal emphysema, most pronounced throughout the lung apices bilaterally. Mild diffuse bronchial wall thickening. Compared to the prior study there is a marked increase in patchy areas of ground-glass attenuation that are most pronounced throughout the mid and lower lungs bilaterally, particularly in the lower lobes. This is associated with increased thickening of the peribronchovascular interstitium, with peribronchovascular and subpleural reticulation. Several areas of potential honeycombing appear to be developing, however, these areas may alternatively reflect chronic emphysematous changes that are accentuated by the worsening surrounding ground-glass attenuation and interstitial prominence. These regions are most profound in the posterior aspect of the upper lobes of the lungs bilaterally, best appreciated on the sagittal reconstructions immediately above the major fissures bilaterally. High-resolution imaging was not ordered and not performed. No pleural effusions.  Upper Abdomen: 3 mm calcification in the upper pole of the right kidney may be vascular, or could be a nonobstructive calculus.  Musculoskeletal: There are no aggressive appearing lytic or blastic lesions noted in the visualized portions of the skeleton.  IMPRESSION: 1. The appearance of the lungs has dramatically changed compared to the prior study from 07/08/2012. There is again a background of moderate centrilobular and paraseptal emphysema, and there are  findings that are undoubtedly related to an underlying chronic interstitial lung disease. Given the appearance on the prior examination, this is favored to represent evolving usual interstitial pneumonia, likely with a superimposed acute interstitial pneumonia at this time. Repeat high-resolution chest CT in 3-6 months after resolution of the patient's acute symptoms would be useful to reassess for temporal changes in the baseline appearance of the lung parenchyma. 2. Atherosclerosis, including left main and 3 vessel coronary artery disease. Assessment for potential risk factor modification, dietary therapy or pharmacologic therapy may be warranted, if clinically indicated. 3. Additional incidental findings, as above.   Electronically Signed   By: Vinnie Langton M.D.   On: 03/25/2013 18:28   Dg Chest Port 1 View  03/26/2013   CLINICAL DATA:  Check endotracheal tube.  EXAM: PORTABLE CHEST - 1 VIEW  COMPARISON:  03/25/2013  FINDINGS: Unchanged low lung volumes with diffuse interstitial opacity. Based on chest CT, likely progressive interstitial lung disease. No effusion, new opacification or pneumothorax. Reportedly the patient is on BiPAP, not intubated. No cardiomegaly.  IMPRESSION: Unchanged low lung volumes and diffuse interstitial opacity.   Electronically Signed   By: Jorje Guild M.D.   On:  03/26/2013 06:15   Dg Chest Port 1 View  03/25/2013   CLINICAL DATA:  Shortness of breath.  EXAM: PORTABLE CHEST - 1 VIEW  COMPARISON:  03/24/2013  FINDINGS: Diffuse interstitial prominence throughout the lungs is stable since prior study. This could represent chronic interstitial disease although an acute process such as infection or edema cannot be excluded. No significant change since recent study, but findings are substantially progressed since April 2014 study.  Heart is upper limits normal in size. No visible effusions. No acute bony abnormality.  IMPRESSION: Continued diffuse interstitial prominence  throughout the lungs. While I component of this may be chronic interstitial lung disease, I cannot exclude superimposed edema or infection.   Electronically Signed   By: Rolm Baptise M.D.   On: 03/25/2013 14:48   Dg Chest Port 1 View  03/24/2013   CLINICAL DATA:  Fever and shortness of breath  EXAM: PORTABLE CHEST - 1 VIEW  COMPARISON:  03/23/2013  FINDINGS: Cardiac shadow is stable. The lungs are well aerated bilaterally. Diffuse interstitial changes are again identified. No definitive acute infiltrate is seen. No sizable effusion is noted. No bony abnormality is seen.  IMPRESSION: Chronic changes without acute abnormality.   Electronically Signed   By: Inez Catalina M.D.   On: 03/24/2013 22:02   ASSESSMENT / PLAN:  PULMONARY A:   Acute hypoxemic respiratory failure Pulmonary fibrosis on oxygen, possible accelerated now AECOPD Pneumonia No evidence of acute bronchospasm Pulmonary embolism less likely P:   Goal SpO2>92 Supplemental oxygen Trend ABG / CXR Albuterol PRN Solu-Medrol 125 mg q6h Defer chest CTA as marked worsening of ILD is likely responsible for severe hypoxia BiPAP PRN DNI  CARDIOVASCULAR A:  Tachycardia in setting of acute hypoxemia P:  Cardiac monitoring  RENAL A:   Eu to hypovolemic Urine retention / BPH  P:   NS@100  Place Foley Restart Flomax  GASTROINTESTINAL A:   Nutrition GI Px is not indicated P:   Diest as tolerated  HEMATOLOGIC A:   Mild anemia VTE Px P:  Trend CBC Heparin for DVT Px  INFECTIOUS A:   Pneumonia P:   Cultures and antibiotics as above  ENDOCRINE  A:   At risk for steroid induced hyperglycemia  P:   SSI  NEUROLOGIC A:  Acute encephalopathy in setting of hypoxemia / infection, resolving P:   Monitor OOBTC  I have personally obtained history, examined patient, evaluated and interpreted laboratory and imaging results, reviewed medical records, formulated assessment / plan and placed orders.  Doree Fudge, MD [facility] [place] Pager: [phone]  03/26/2013, 10:51 AM

## 2013-03-26 NOTE — Progress Notes (Signed)
[facility] PROGRESS NOTE  James Pope [id number] DOB: 05/10/1932 DOA: 04/13/2013 PCP: Foye Spurling, MD  Brief narrative: 80 yo male with h/o chronic resp failure secondary to pulmonary fibrosis, oxygen dependent, on 2 L oxygen at home, COPD, CKD who presented to [facility] ED 04/17/2013 with complaints of lower extremity swelling and weight gain for past one week prior to this admission. He reports shortness of breath but no chest pain or palpitations or orthopnea. On admission, his oxygen saturation was in need of 78 on 2 L nasal cannula oxygen support. Chest x-ray on admission showed no acute cardiopulmonary disease but stable chronic interstitial disease likely pulmonary fibrosis. On the morning of 03/25/2013  pt acutely decompensated and was very short of breath requiring ventimask above 50% to keep O2 saturation above 90%. Pr required transfer to SDU for further monitoring.  Assessment/Plan:   Principal Problem:  Acute respiratory failure with hypoxia  - Likely secondary to combination of pulmonary fibrosis and COPD versus CHF  - repeated CXR on 3/7 showed continued diffuse interstitial prominence throughout the lungs, possibly a component of chronic interstitial lung disease versus a superimposed edema or infection. CT chest done 3/7 revealed moderate centrilobular and paraseptal emphysema, and findings related to an underlying chronic interstitial lung disease. This was favored to represent evolving usual interstitial pneumonia, likely with a superimposed acute interstitial pneumonia. - oxygen saturation 90% this morning with ventimask - added antibiotics for possible HCAP, sepsis (vanco, zosyn started 3/7, Levaquin started 3/6); added tamiflu. Respiratory viral panel negative so will d/c tamiflu  - Continue solumedrol 125 mg IV Q 6 hours  - Continue Albuterol every 2 hours PRN  Active Problems:  Probable HCAP, influenza  - as mentioned above: vanco, zosyn, Levaquin  - respiratory  viral panel negative so we will d/c tamiflu  - follow up blood culture results  Hypokalemia  - Secondary to Lasix  - Repleted and this am WNL Acute renal failure  - Likely secondary to dehydration  - resolved with IV fluids  Hypertension  - hold Cardizem, BP 113/80 BPH  - hold flomax for now; monitor BP    Code Status: DNR/DNI  Family Communication: no family at the bedside  Disposition Plan: remains inpatient   Consultants:  PCCM Procedures:  None  Antibiotics:  Levaquin 03/24/2013 -->  Zosyn 03/25/2013 -->  Vancomycin 03/25/2013 -->  Oseltamivir 03/25/2013 -->   Leisa Lenz, MD  [facility] Pager [phone]  If 7PM-7AM, please contact night-coverage [email or web address] Password [facility] 03/26/2013, 9:51 AM   LOS: 4 days   HPI/Subjective: No acute overnight events.   Objective: Filed Vitals:   03/26/13 0600 03/26/13 0700 03/26/13 0800 03/26/13 0835  BP: 117/81 104/78 113/80   Pulse: 96  73   Temp:   97.7 F (36.5 C)   TempSrc:   Oral   Resp: 17 17 21    Height:      Weight:      SpO2: 100%  96% 98%    Intake/Output Summary (Last 24 hours) at 03/26/13 0951 Last data filed at 03/26/13 0944  Gross per 24 hour  Intake 2602.5 ml  Output    900 ml  Net 1702.5 ml    Exam:   General:  Pt is alert, follows commands appropriately, not in acute distress  Cardiovascular: Regular rate and rhythm, S1/S2 appreciated   Respiratory: diminished breath sounds throughout, no wheezing   Abdomen: Soft, non tender, non distended, bowel sounds present, no guarding  Extremities: No edema, pulses DP  and PT palpable bilaterally  Neuro: Grossly nonfocal  Data Reviewed: Basic Metabolic Panel:  Recent Labs Lab 04/02/2013 2016 03/23/13 0328 03/24/13 0315 03/25/13 0640 03/26/13 0002  NA 142 144 140 135* 135*  K 3.6* 3.1* 3.8 3.5* 5.3  CL 100 101 98 95* 96  CO2 23 23 28 26 27   GLUCOSE 142* 189* 113* 131* 182*  BUN 17 19 21 19 19   CREATININE 1.42* 1.35 1.26 1.35 1.35   CALCIUM 9.7 9.1 9.4 9.1 9.5   Liver Function Tests:  Recent Labs Lab 03/25/13 0640  AST 28  ALT 11  ALKPHOS 63  BILITOT 1.3*  PROT 6.8  ALBUMIN 2.7*   No results found for this basename: LIPASE, AMYLASE,  in the last 168 hours No results found for this basename: AMMONIA,  in the last 168 hours CBC:  Recent Labs Lab 03/30/2013 2016 03/23/13 0328 03/25/13 0640 03/26/13 0002  WBC 10.1 10.7* 11.9* 8.2  NEUTROABS  --   --  9.7*  --   HGB 12.6* 12.1* 12.4* 12.4*  HCT 37.1* 36.0* 36.0* 37.4*  MCV 93.5 94.0 92.8 94.9  PLT 188 167 170 160   Cardiac Enzymes:  Recent Labs Lab 03/27/2013 2016 03/25/13 1327 03/25/13 1827 03/26/13 0002  TROPONINI <0.30 <0.30 <0.30 <0.30   BNP: No components found with this basename: POCBNP,  CBG:  Recent Labs Lab 03/25/13 1730 03/25/13 2051 03/26/13 0014 03/26/13 0350 03/26/13 0635  GLUCAP 165* 175* 168* 137* 115*    RESPIRATORY VIRUS PANEL     Status: None   Collection Time    03/25/13  5:49 AM      Result Value Ref Range Status   Source - RVPAN NOSE   Final   Respiratory Syncytial Virus A NOT DETECTED   Final   Respiratory Syncytial Virus B NOT DETECTED   Final   Influenza A NOT DETECTED   Final   Influenza B NOT DETECTED   Final   Parainfluenza 1 NOT DETECTED   Final   Parainfluenza 2 NOT DETECTED   Final   Parainfluenza 3 NOT DETECTED   Final   Metapneumovirus NOT DETECTED   Final   Rhinovirus NOT DETECTED   Final   Adenovirus NOT DETECTED   Final   Influenza A H1 NOT DETECTED   Final   Influenza A H3 NOT DETECTED   Final  MRSA PCR SCREENING     Status: None   Collection Time    03/25/13 11:45 AM      Result Value Ref Range Status   MRSA by PCR NEGATIVE  NEGATIVE Final     Studies: Ct Chest Wo Contrast 03/25/2013     IMPRESSION: 1. The appearance of the lungs has dramatically changed compared to the prior study from 07/08/2012. There is again a background of moderate centrilobular and paraseptal emphysema, and there  are findings that are undoubtedly related to an underlying chronic interstitial lung disease. Given the appearance on the prior examination, this is favored to represent evolving usual interstitial pneumonia, likely with a superimposed acute interstitial pneumonia at this time. Repeat high-resolution chest CT in 3-6 months after resolution of the patient's acute symptoms would be useful to reassess for temporal changes in the baseline appearance of the lung parenchyma. 2. Atherosclerosis, including left main and 3 vessel coronary artery disease. Assessment for potential risk factor modification, dietary therapy or pharmacologic therapy may be warranted, if clinically indicated. 3. Additional incidental findings, as above.    Dg Chest Port 1  View 03/26/2013    IMPRESSION: Unchanged low lung volumes and diffuse interstitial opacity.   Dg Chest Port 1 View 03/25/2013     IMPRESSION: Continued diffuse interstitial prominence throughout the lungs. While component of this may be chronic interstitial lung disease, I cannot exclude superimposed edema or infection.    Dg Chest Port 1 View 03/24/2013     IMPRESSION: Chronic changes without acute abnormality.     Scheduled Meds: . heparin subcutaneous  5,000 Units Subcutaneous 3 times per day  . insulin aspart  0-15 Units Subcutaneous 6 times per day  . ketotifen  1 drop Both Eyes BID  . levofloxacin (LEVAQUIN)  750 mg Intravenous QHS  . methylPREDNISolone   125 mg Intravenous Q6H  . oseltamivir  75 mg Oral Daily  . piperacillin-tazobactam   3.375 g Intravenous Q8H  . vancomycin  750 mg Intravenous Q12H   Continuous Infusions: . sodium chloride 100 mL/hr at 03/26/13 0800

## 2013-03-27 ENCOUNTER — Inpatient Hospital Stay (HOSPITAL_COMMUNITY): Payer: Medicare Other

## 2013-03-27 DIAGNOSIS — J438 Other emphysema: Secondary | ICD-10-CM

## 2013-03-27 LAB — BASIC METABOLIC PANEL
BUN: 27 mg/dL — ABNORMAL HIGH (ref 6–23)
CALCIUM: 9.4 mg/dL (ref 8.4–10.5)
CO2: 24 mEq/L (ref 19–32)
CREATININE: 1.43 mg/dL — AB (ref 0.50–1.35)
Chloride: 103 mEq/L (ref 96–112)
GFR calc Af Amer: 53 mL/min — ABNORMAL LOW (ref >90)
GFR calc non Af Amer: 45 mL/min — ABNORMAL LOW (ref >90)
Glucose, Bld: 148 mg/dL — ABNORMAL HIGH (ref 70–99)
Potassium: 4.2 mEq/L (ref 3.7–5.3)
Sodium: 140 mEq/L (ref 137–147)

## 2013-03-27 LAB — URINE CULTURE

## 2013-03-27 LAB — CBC
HCT: 33.3 % — ABNORMAL LOW (ref 39.0–52.0)
Hemoglobin: 11 g/dL — ABNORMAL LOW (ref 13.0–17.0)
MCH: 31 pg (ref 26.0–34.0)
MCHC: 33 g/dL (ref 30.0–36.0)
MCV: 93.8 fL (ref 78.0–100.0)
Platelets: 143 10*3/uL — ABNORMAL LOW (ref 150–400)
RBC: 3.55 MIL/uL — ABNORMAL LOW (ref 4.22–5.81)
RDW: 13.9 % (ref 11.5–15.5)
WBC: 14.8 10*3/uL — ABNORMAL HIGH (ref 4.0–10.5)

## 2013-03-27 LAB — GLUCOSE, CAPILLARY
Glucose-Capillary: 125 mg/dL — ABNORMAL HIGH (ref 70–99)
Glucose-Capillary: 136 mg/dL — ABNORMAL HIGH (ref 70–99)
Glucose-Capillary: 138 mg/dL — ABNORMAL HIGH (ref 70–99)

## 2013-03-27 LAB — PROCALCITONIN: Procalcitonin: 0.24 ng/mL

## 2013-03-27 LAB — VANCOMYCIN, TROUGH: Vancomycin Tr: 12.9 ug/mL (ref 10.0–20.0)

## 2013-03-27 MED ORDER — METHYLPREDNISOLONE SODIUM SUCC 125 MG IJ SOLR
60.0000 mg | Freq: Four times a day (QID) | INTRAMUSCULAR | Status: DC
  Administered 2013-03-27 – 2013-03-29 (×10): 60 mg via INTRAVENOUS
  Filled 2013-03-27 (×16): qty 0.96

## 2013-03-27 MED ORDER — LEVOFLOXACIN IN D5W 750 MG/150ML IV SOLN: 750.0000 mg | INTRAVENOUS | Status: DC

## 2013-03-27 MED ORDER — DILTIAZEM HCL 30 MG PO TABS
30.0000 mg | ORAL_TABLET | Freq: Two times a day (BID) | ORAL | Status: DC
  Administered 2013-03-27 – 2013-03-29 (×5): 30 mg via ORAL
  Filled 2013-03-27 (×8): qty 1

## 2013-03-27 NOTE — Progress Notes (Addendum)
[facility] PROGRESS NOTE  James Pope PPI:[id number] DOB: 1932/10/15 DOA: 04/11/2013 PCP: Foye Spurling, MD  Brief narrative: 80 yo male with h/o chronic resp failure secondary to pulmonary fibrosis, oxygen dependent, on 2 L oxygen at home, COPD, CKD who presented to [facility] ED 03/23/2013 with complaints of lower extremity swelling and weight gain for past one week prior to this admission. He reports shortness of breath but no chest pain or palpitations or orthopnea. On admission, his oxygen saturation was in need of 78 on 2 L nasal cannula oxygen support. Chest x-ray on admission showed no acute cardiopulmonary disease but stable chronic interstitial disease likely pulmonary fibrosis. On the morning of 03/25/2013 pt acutely decompensated and was very short of breath requiring ventimask above 50% to keep O2 saturation above 90%. Pr required transfer to SDU for further monitoring.  Assessment/Plan:   Principal Problem:  Acute respiratory failure with hypoxia  - Likely secondary to combination of pulmonary fibrosis and COPD versus CHF  - CXR on 3/7 showed continued diffuse interstitial prominence throughout the lungs, possibly a component of chronic interstitial lung disease versus a superimposed edema or infection. CT chest done 3/7 revealed moderate centrilobular and paraseptal emphysema, and findings related to an underlying chronic interstitial lung disease. This was favored to represent evolving usual interstitial pneumonia, likely with a superimposed acute interstitial pneumonia. No significant changes on subsequent CXR done 3/8 and 3/9 - oxygen saturation 90% this morning with ventimask  - added antibiotics for possible HCAP, sepsis (vanco, zosyn started 3/7, Levaquin started 3/6); added tamiflu 3/7 howevere since respiratory viral panel negative we discontinued tamiflu 03/26/2013  - Continue solumedrol but decrease dose from  125 to 60 mg IV Q 6 hours  - Continue Albuterol every 2 hours PRN    Active Problems:  Probable HCAP - as mentioned above: Vanco, Zosyn, Levaquin  - blood cultures to date are negative  Leukocytosis - likely due to HCAP  - management with abx as mentioned above, vanco, Levaquin and zosyn  Anemia of chronic kidney disease  - hemoglobin 11 this am - no indications for transfusion  Mild thrombocytopenia - platelet count 143 this am - d/c heparin sub Q this am - use SCD's for DVT prophylaxis  Hypokalemia  - Secondary to Lasix  - Repleted and this am WNL  Acute on chronic renal failure, CKD stage 3 - creatinine in 03/2012 about 1.5  - on this admission creatinine initially resolved with IV fluids but then creatinine bump noticed this am, 1.43 Hypertension  - BP stable this am; we can restart Cardizem BPH  - restart flomax  Code Status: DNR/DNI  Family Communication: no family at the bedside  Disposition Plan: remains inpatient   Consultants:  PCCM Procedures:  None  Antibiotics:  Levaquin 03/24/2013 -->  Zosyn 03/25/2013 -->  Vancomycin 03/25/2013 -->  Oseltamivir 03/25/2013 --> 03/26/2013   Leisa Lenz, MD  [facility] Pager [phone]  If 7PM-7AM, please contact night-coverage [email or web address] Password [email or web address] 03/27/2013, 11:17 AM   LOS: 5 days   HPI/Subjective: Has Ventimask on, no acute distress at this time.   Objective: Filed Vitals:   03/27/13 0400 03/27/13 0500 03/27/13 0650 03/27/13 0800  BP: 141/81  133/88 131/80  Pulse: 79  79 87  Temp: 98.4 F (36.9 C)   97.3 F (36.3 C)  TempSrc:    Oral  Resp: 21  24 23   Height:      Weight:  81.6 kg (179 lb 14.3 oz)  SpO2: 97%  97% 95%    Intake/Output Summary (Last 24 hours) at 03/27/13 1117 Last data filed at 03/27/13 1000  Gross per 24 hour  Intake   4520 ml  Output   1400 ml  Net   3120 ml    Exam:   General:  Pt is alert, follows commands appropriately, not in acute distress  Cardiovascular: Regular rate and rhythm, S1/S2 appreciated   Respiratory: Clear to  auscultation bilaterally, no wheezing  Abdomen: Soft, non tender, non distended, bowel sounds present, no guarding  Extremities: No edema, pulses DP and PT palpable bilaterally  Neuro: Grossly nonfocal  Data Reviewed: Basic Metabolic Panel:  Recent Labs Lab 03/23/13 0328 03/24/13 0315 03/25/13 0640 03/26/13 0002 03/27/13 0325  NA 144 140 135* 135* 140  K 3.1* 3.8 3.5* 5.3 4.2  CL 101 98 95* 96 103  CO2 23 28 26 27 24   GLUCOSE 189* 113* 131* 182* 148*  BUN 19 21 19 19  27*  CREATININE 1.35 1.26 1.35 1.35 1.43*  CALCIUM 9.1 9.4 9.1 9.5 9.4   Liver Function Tests:  Recent Labs Lab 03/25/13 0640  AST 28  ALT 11  ALKPHOS 63  BILITOT 1.3*  PROT 6.8  ALBUMIN 2.7*   No results found for this basename: LIPASE, AMYLASE,  in the last 168 hours No results found for this basename: AMMONIA,  in the last 168 hours CBC:  Recent Labs Lab 04/07/2013 2016 03/23/13 0328 03/25/13 0640 03/26/13 0002 03/27/13 0325  WBC 10.1 10.7* 11.9* 8.2 14.8*  NEUTROABS  --   --  9.7*  --   --   HGB 12.6* 12.1* 12.4* 12.4* 11.0*  HCT 37.1* 36.0* 36.0* 37.4* 33.3*  MCV 93.5 94.0 92.8 94.9 93.8  PLT 188 167 170 160 143*   Cardiac Enzymes:  Recent Labs Lab 04/01/2013 2016 03/25/13 1327 03/25/13 1827 03/26/13 0002  TROPONINI <0.30 <0.30 <0.30 <0.30   BNP: No components found with this basename: POCBNP,  CBG:  Recent Labs Lab 03/26/13 1206 03/26/13 1558 03/26/13 2031 03/26/13 2349 03/27/13 0803  GLUCAP 140* 161* 174* 131* 125*    CULTURE, BLOOD (ROUTINE X 2)     Status: None   Collection Time    03/24/13 10:15 PM      Result Value Ref Range Status   Specimen Description BLOOD RIGHT ARM   Final   Value:        BLOOD CULTURE RECEIVED NO GROWTH TO DATE      Performed at [facility]   Report Status PENDING   Incomplete  CULTURE, BLOOD (ROUTINE X 2)     Status: None   Collection Time    03/24/13 10:18 PM      Result Value Ref Range Status   Specimen Description BLOOD  RIGHT WRIST   Final   Value:        BLOOD CULTURE RECEIVED NO GROWTH TO DATE      Performed at [facility]   Report Status PENDING   Incomplete  URINE CULTURE     Status: None   Collection Time    03/25/13  4:30 AM      Result Value Ref Range Status   Specimen Description URINE, RANDOM   Final   Value: ESCHERICHIA COLI     Performed at [facility]   Report Status 03/27/2013 FINAL   Final   Organism ID, Bacteria ESCHERICHIA COLI   Final  RESPIRATORY VIRUS PANEL     Status: None  Collection Time    03/25/13  5:49 AM      Result Value Ref Range Status   Source - RVPAN NOSE   Final   Respiratory Syncytial Virus A NOT DETECTED   Final   Respiratory Syncytial Virus B NOT DETECTED   Final   Influenza A NOT DETECTED   Final   Influenza B NOT DETECTED   Final   Parainfluenza 1 NOT DETECTED   Final   Parainfluenza 2 NOT DETECTED   Final   Parainfluenza 3 NOT DETECTED   Final   Metapneumovirus NOT DETECTED   Final   Rhinovirus NOT DETECTED   Final   Adenovirus NOT DETECTED   Final   Influenza A H1 NOT DETECTED   Final   Influenza A H3 NOT DETECTED   Final  MRSA PCR SCREENING     Status: None   Collection Time    03/25/13 11:45 AM      Result Value Ref Range Status   MRSA by PCR NEGATIVE  NEGATIVE Final     Studies: Ct Chest Wo Contrast 03/25/2013     IMPRESSION: 1. The appearance of the lungs has dramatically changed compared to the prior study from 07/08/2012. There is again a background of moderate centrilobular and paraseptal emphysema, and there are findings that are undoubtedly related to an underlying chronic interstitial lung disease. Given the appearance on the prior examination, this is favored to represent evolving usual interstitial pneumonia, likely with a superimposed acute interstitial pneumonia at this time. Repeat high-resolution chest CT in 3-6 months after resolution of the patient's acute symptoms would be useful to reassess for temporal changes in the  baseline appearance of the lung parenchyma. 2. Atherosclerosis, including left main and 3 vessel coronary artery disease. Assessment for potential risk factor modification, dietary therapy or pharmacologic therapy may be warranted, if clinically indicated. 3. Additional incidental findings, as above.   Electronically Signed   By: Vinnie Langton M.D.   On: 03/25/2013 18:28   Dg Chest Port 1 View 03/27/2013   IMPRESSION: Persistent unchanged severe pulmonary interstitial lung disease.   Dg Chest Port 1 View 03/26/2013  IMPRESSION: Unchanged low lung volumes and diffuse interstitial opacity.    Dg Chest Port 1 View 03/25/2013    IMPRESSION: Continued diffuse interstitial prominence throughout the lungs. While I component of this may be chronic interstitial lung disease, I cannot exclude superimposed edema or infection.     Scheduled Meds: . heparin subcutaneous  5,000 Units Subcutaneous 3 times per day  . insulin aspart  0-15 Units Subcutaneous 6 times per day  . ketotifen  1 drop Both Eyes BID  .  levofloxacin (LEVAQUIN)  750 mg Intravenous Q48H  . methylPREDNISolone   60 mg Intravenous Q6H  . piperacillin-tazobactam  3.375 g Intravenous Q8H  . tamsulosin  0.8 mg Oral QHS  . vancomycin  750 mg Intravenous Q12H   Continuous Infusions: . sodium chloride 100 mL/hr at 03/27/13 0140

## 2013-03-27 NOTE — Progress Notes (Addendum)
ANTIBIOTIC CONSULT NOTE - Follow up  Pharmacy Consult for Vancomycin, Zosyn, Levaquin Indication: HCAP vs asp PNA and E.coli UTI  Allergies  Allergen Reactions  . Colcrys [Colchicine] Itching  . Other Other (See Comments)    Medication-NapraPAC (prevacid and naprosyn) Reaction-Hiccups  . Prednisone Other (See Comments)    Hiccups    Patient Measurements: Height: '5\' 10"'  (177.8 cm) Weight: 179 lb 14.3 oz (81.6 kg) IBW/kg (Calculated) : 73  Vital Signs: Temp: 97.3 F (36.3 C) (03/09 0800) Temp src: Oral (03/09 0800) BP: 133/88 mmHg (03/09 0650) Pulse Rate: 79 (03/09 0650) Intake/Output from previous day: 03/08 0701 - 03/09 0700 In: 3780 [P.O.:780; I.V.:2400; IV Piggyback:600] Out: 2000 [Urine:2000] Intake/Output from this shift: Total I/O In: 600 [P.O.:600] Out: -   Labs:  Recent Labs  03/25/13 0640 03/26/13 0002 03/27/13 0325  WBC 11.9* 8.2 14.8*  HGB 12.4* 12.4* 11.0*  PLT 170 160 143*  CREATININE 1.35 1.35 1.43*   Estimated Creatinine Clearance: 44 ml/min (by C-G formula based on Cr of 1.43). No results found for this basename: VANCOTROUGH, VANCOPEAK, VANCORANDOM, [place], GENTPEAK, GENTRANDOM, TOBRATROUGH, TOBRAPEAK, TOBRARND, AMIKACINPEAK, AMIKACINTROU, AMIKACIN,  in the last 72 hours   Microbiology: Recent Results (from the past 720 hour(s))  CULTURE, BLOOD (ROUTINE X 2)     Status: None   Collection Time    03/24/13 10:15 PM      Result Value Ref Range Status   Specimen Description BLOOD RIGHT ARM   Final   Special Requests     Final   Value: BOTTLES DRAWN AEROBIC AND ANAEROBIC 10 CC BOTH BOTTLES   Culture  Setup Time     Final   Value: 03/25/2013 03:30     Performed at [facility]   Culture     Final   Value:        BLOOD CULTURE RECEIVED NO GROWTH TO DATE CULTURE WILL BE HELD FOR 5 DAYS BEFORE ISSUING A FINAL NEGATIVE REPORT     Performed at [facility]   Report Status PENDING   Incomplete  CULTURE, BLOOD (ROUTINE X 2)      Status: None   Collection Time    03/24/13 10:18 PM      Result Value Ref Range Status   Specimen Description BLOOD RIGHT WRIST   Final   Special Requests     Final   Value: BOTTLES DRAWN AEROBIC AND ANAEROBIC 10 CC BOTH BOTTLES   Culture  Setup Time     Final   Value: 03/25/2013 03:31     Performed at [facility]   Culture     Final   Value:        BLOOD CULTURE RECEIVED NO GROWTH TO DATE CULTURE WILL BE HELD FOR 5 DAYS BEFORE ISSUING A FINAL NEGATIVE REPORT     Performed at [facility]   Report Status PENDING   Incomplete  URINE CULTURE     Status: None   Collection Time    03/25/13  4:30 AM      Result Value Ref Range Status   Specimen Description URINE, RANDOM   Final   Special Requests NONE   Final   Culture  Setup Time     Final   Value: 03/25/2013 11:41     Performed at [facility]     Final   Value: 20,OOO COLONIES/ML     Performed at [facility]  Final   Value: ESCHERICHIA COLI     Performed at [facility]   Report Status 03/27/2013 FINAL   Final   Organism ID, Bacteria ESCHERICHIA COLI   Final  RESPIRATORY VIRUS PANEL     Status: None   Collection Time    03/25/13  5:49 AM      Result Value Ref Range Status   Source - RVPAN NOSE   Final   Respiratory Syncytial Virus A NOT DETECTED   Final   Respiratory Syncytial Virus B NOT DETECTED   Final   Influenza A NOT DETECTED   Final   Influenza B NOT DETECTED   Final   Parainfluenza 1 NOT DETECTED   Final   Parainfluenza 2 NOT DETECTED   Final   Parainfluenza 3 NOT DETECTED   Final   Metapneumovirus NOT DETECTED   Final   Rhinovirus NOT DETECTED   Final   Adenovirus NOT DETECTED   Final   Influenza A H1 NOT DETECTED   Final   Influenza A H3 NOT DETECTED   Final   Comment: (NOTE)           Normal Reference Range for each Analyte: NOT DETECTED     Testing performed using the Luminex xTAG Respiratory Viral Panel test     kit.     This test was  developed and its performance characteristics determined     by [facility]. It has not been cleared or approved by the [place]     Food and Drug Administration. This test is used for clinical purposes.     It should not be regarded as investigational or for research. This     laboratory is certified under the [place] (CLIA) as qualified to perform high complexity     clinical laboratory testing.     Performed at [place] PCR SCREENING     Status: None   Collection Time    03/25/13 11:45 AM      Result Value Ref Range Status   MRSA by PCR NEGATIVE  NEGATIVE Final   Comment:            The GeneXpert MRSA Assay (FDA     approved for NASAL specimens     only), is one component of a     comprehensive MRSA colonization     surveillance program. It is not     intended to diagnose MRSA     infection nor to guide or     monitor treatment for     MRSA infections.    Assessment: 80 yo male known to pharmacy already after dosing Levaquin for AECOPD now to start vancomycin and Zosyn per pharmacy dosing for HCAP vs asp PNA.   3/7 >> Tamiflu >> 3/9 3/7 >> Levaquin >> 3/7 >> Vanc >> 3/7 >> Zosyn >>  Tmax: AF WBCs: elev, rising now(steroids started 3/7) Renal: SCr up from yesterday, 44CG/N PCT: 0.18 -> 0.42 -> 0.24   3/6 blood x2: ngtd 3/7 urine: 20k Ecoli (sens: ancef,CTX,gent/tobra,nitro,Zosyn. Res: amp,FQs,septra) 3/7 Resp virus panel: neg  Goal of Therapy:  Appropriate antibiotic dosing for renal function; eradication of infection Vancomycin trough level 15-20 mcg/ml  Plan:   Reduce Levaquin from 782m IV q24h to q48h d/t CrCl <50.  Cont Vanc 7548mIV q12h. Check Vanc trough this afternoon.  Cont Zosyn 3.375g IV Q8H infused over 4hrs.  Follow up renal fxn  and culture results.  Romeo Rabon, PharmD, pager [phone]. 03/27/2013,10:30 AM.  Addendum:  Vanc trough reported as 12.15m/l, just slightly below  goal range. However, SCr is rising so I'm concerned about accumulation so I will cont 7580mIV q12h for now.   ToRomeo RabonPharmD, pager [phone]/09/2013,2:12 PM.

## 2013-03-27 NOTE — Progress Notes (Signed)
Pt was sitting up in chair when I arrived. Pt talked with me about family and church. Pt excused himself for hiccups but said they would go away. Pt asked for prayer for family. After prayer pt expressed appreciation for visit. Ernest Haber Chaplain  03/27/13 1300  Clinical Encounter Type  Visited With Patient

## 2013-03-27 NOTE — Progress Notes (Signed)
PULMONARY / CRITICAL CARE MEDICINE  Name: James Pope MRN: [id number] DOB: 03/22/1932    ADMISSION DATE:  04-05-13 CONSULTATION DATE:  03/25/2013  REFERRING MD :  [facility] PRIMARY SERVICE:  TRH  CHIEF COMPLAINT:  Hypoxemia  BRIEF PATIENT DESCRIPTION: 80 y/o with past medical history of pulmonary fibrosis on home 3L oxygen brought to ED on 3/4 and admitted with hypoxia. On 3/7 developed acute respiratory distress and was transferred to ICU.  SIGNIFICANT EVENTS / STUDIES:  3/4  Admitted with progressive hypoxia 3/5  TTE >>> EF 65%, normal RV size and function 3/7  Transferred to ICU, DNR/DNI established, BiPAP 3/7  Chest CT >>> Emphysema, ILD ( worse appearance vs superimposed interstitial pneumonia )  LINES / TUBES: Foley 3/8 >>>  CULTURES: 3/6 Blood >>> 3/7 Urine >>> ESCHERICHIA COLI ( 20K) 3/7 RVP >>> neg  ANTIBIOTICS: Zosyn 3/7 >>> Vancomycin 3/7 >>> Levaquin 3/7 >>>  INTERVAL HISTORY:  Pt denies acute complaints.  Reports he sat up in the chair & walked the unit yesterday.  Relays that his degree of dyspnea seems to be back at his baseline.  Foley remains in place.   VITAL SIGNS: Temp:  [97.5 F (36.4 C)-98.4 F (36.9 C)] 98.4 F (36.9 C) (03/09 0400) Pulse Rate:  [51-114] 79 (03/09 0650) Resp:  [16-41] 24 (03/09 0650) BP: (101-141)/(72-94) 133/88 mmHg (03/09 0650) SpO2:  [86 %-100 %] 97 % (03/09 0650) FiO2 (%):  [55 %] 55 % (03/08 1600) Weight:  [179 lb 14.3 oz (81.6 kg)] 179 lb 14.3 oz (81.6 kg) (03/09 0500)  INTAKE / OUTPUT: Intake/Output     03/08 0701 - 03/09 0700 03/09 0701 - 03/10 0700   P.O. 780    I.V. (mL/kg) 2400 (29.4)    IV Piggyback 600    Total Intake(mL/kg) 3780 (46.3)    Urine (mL/kg/hr) 2000 (1)    Total Output 2000     Net +1780          Stool Occurrence 1 x      PHYSICAL EXAMINATION: General:  Appears acutely ill, no distress Neuro:  Awake, appropriate HEENT:  PERRL, dry membranes Cardiovascular:  Regular, no murmurs Lungs:   Bilateral diminished air entry, rales Abdomen:  Soft, nontender, bowel sounds diminished Musculoskeletal:  Moves all extremities, no edema Skin:  Intact  LABS: CBC  Recent Labs Lab 03/25/13 0640 03/26/13 0002 03/27/13 0325  WBC 11.9* 8.2 14.8*  HGB 12.4* 12.4* 11.0*  HCT 36.0* 37.4* 33.3*  PLT 170 160 143*   BMET  Recent Labs Lab 03/25/13 0640 03/26/13 0002 03/27/13 0325  NA 135* 135* 140  K 3.5* 5.3 4.2  CL 95* 96 103  CO2 26 27 24   BUN 19 19 27*  CREATININE 1.35 1.35 1.43*  GLUCOSE 131* 182* 148*   Electrolytes  Recent Labs Lab 03/25/13 0640 03/26/13 0002 03/27/13 0325  CALCIUM 9.1 9.5 9.4   Sepsis Markers  Recent Labs Lab 03/25/13 0647 03/26/13 0002 03/27/13 0325  PROCALCITON 0.18 0.42 0.24   ABG  Recent Labs Lab 03/25/13 0531 03/25/13 1130  PHART 7.487* 7.490*  PCO2ART 36.1 32.9*  PO2ART 112.0* 42.0*   Liver Enzymes  Recent Labs Lab 03/25/13 0640  AST 28  ALT 11  ALKPHOS 63  BILITOT 1.3*  ALBUMIN 2.7*   Cardiac Enzymes  Recent Labs Lab 2013-04-05 2016 03/23/13 0328 03/25/13 1327 03/25/13 1827 03/26/13 0002  TROPONINI <0.30  --  <0.30 <0.30 <0.30  PROBNP 134.2 123.8  --   --   --  Glucose  Recent Labs Lab 03/26/13 0350 03/26/13 0635 03/26/13 1206 03/26/13 1558 03/26/13 2031 03/26/13 2349  GLUCAP 137* 115* 140* 161* 174* 131*   IMAGING:  Ct Chest Wo Contrast  03/25/2013   CLINICAL DATA:  Hypoxia.  Known history of pulmonary fibrosis.  EXAM: CT CHEST WITHOUT CONTRAST  TECHNIQUE: Multidetector CT imaging of the chest was performed following the standard protocol without IV contrast.  COMPARISON:  Chest CT 07/08/2012.  FINDINGS: Mediastinum: Heart size is normal. There is no significant pericardial fluid, thickening or pericardial calcification. There is atherosclerosis of the thoracic aorta, the great vessels of the mediastinum and the coronary arteries, including calcified atherosclerotic plaque in the left main, left  anterior descending, left circumflex and right coronary arteries. Numerous borderline enlarged mediastinal and hilar lymph nodes, similar to the prior examination, presumably reactive in the setting of chronic interstitial lung disease. Esophagus is unremarkable in appearance.  Lungs/Pleura: There is a background of moderate centrilobular and paraseptal emphysema, most pronounced throughout the lung apices bilaterally. Mild diffuse bronchial wall thickening. Compared to the prior study there is a marked increase in patchy areas of ground-glass attenuation that are most pronounced throughout the mid and lower lungs bilaterally, particularly in the lower lobes. This is associated with increased thickening of the peribronchovascular interstitium, with peribronchovascular and subpleural reticulation. Several areas of potential honeycombing appear to be developing, however, these areas may alternatively reflect chronic emphysematous changes that are accentuated by the worsening surrounding ground-glass attenuation and interstitial prominence. These regions are most profound in the posterior aspect of the upper lobes of the lungs bilaterally, best appreciated on the sagittal reconstructions immediately above the major fissures bilaterally. High-resolution imaging was not ordered and not performed. No pleural effusions.  Upper Abdomen: 3 mm calcification in the upper pole of the right kidney may be vascular, or could be a nonobstructive calculus.  Musculoskeletal: There are no aggressive appearing lytic or blastic lesions noted in the visualized portions of the skeleton.  IMPRESSION: 1. The appearance of the lungs has dramatically changed compared to the prior study from 07/08/2012. There is again a background of moderate centrilobular and paraseptal emphysema, and there are findings that are undoubtedly related to an underlying chronic interstitial lung disease. Given the appearance on the prior examination, this is  favored to represent evolving usual interstitial pneumonia, likely with a superimposed acute interstitial pneumonia at this time. Repeat high-resolution chest CT in 3-6 months after resolution of the patient's acute symptoms would be useful to reassess for temporal changes in the baseline appearance of the lung parenchyma. 2. Atherosclerosis, including left main and 3 vessel coronary artery disease. Assessment for potential risk factor modification, dietary therapy or pharmacologic therapy may be warranted, if clinically indicated. 3. Additional incidental findings, as above.   Electronically Signed   By: Vinnie Langton M.D.   On: 03/25/2013 18:28   Dg Chest Port 1 View  03/27/2013   CLINICAL DATA:  Infiltrates.  EXAM: PORTABLE CHEST - 1 VIEW  COMPARISON:  DG CHEST 1V PORT dated 03/26/2013; CT CHEST W/O CM dated 03/25/2013; DG CHEST 1V PORT dated 03/25/2013  FINDINGS: Mediastinum and hilar structures are stable. Stable cardiomegaly is present. No pulmonary venous congestion noted. No pleural effusion or pneumothorax. Diffuse severe pulmonary interstitial lung disease is present. A chronic component of interstitial lung disease may be present however active interstitial disease cannot be excluded. There is no interim change from prior exam.  IMPRESSION: Persistent unchanged severe pulmonary interstitial lung disease.   Electronically  Signed   ByMarcello Moores  Register   On: 03/27/2013 07:34   Dg Chest Port 1 View  03/26/2013   CLINICAL DATA:  Check endotracheal tube.  EXAM: PORTABLE CHEST - 1 VIEW  COMPARISON:  03/25/2013  FINDINGS: Unchanged low lung volumes with diffuse interstitial opacity. Based on chest CT, likely progressive interstitial lung disease. No effusion, new opacification or pneumothorax. Reportedly the patient is on BiPAP, not intubated. No cardiomegaly.  IMPRESSION: Unchanged low lung volumes and diffuse interstitial opacity.   Electronically Signed   By: Jorje Guild M.D.   On: 03/26/2013 06:15    Dg Chest Port 1 View  03/25/2013   CLINICAL DATA:  Shortness of breath.  EXAM: PORTABLE CHEST - 1 VIEW  COMPARISON:  03/24/2013  FINDINGS: Diffuse interstitial prominence throughout the lungs is stable since prior study. This could represent chronic interstitial disease although an acute process such as infection or edema cannot be excluded. No significant change since recent study, but findings are substantially progressed since April 2014 study.  Heart is upper limits normal in size. No visible effusions. No acute bony abnormality.  IMPRESSION: Continued diffuse interstitial prominence throughout the lungs. While I component of this may be chronic interstitial lung disease, I cannot exclude superimposed edema or infection.   Electronically Signed   By: Rolm Baptise M.D.   On: 03/25/2013 14:48   ASSESSMENT / PLAN:  A:   Acute on chronic respiratory failure with hx of pulmonary fibrosis and emphysema.  He has marked progression of fibrosis on CT chest from June 2014 to March 2015.  Reports subjective improvement with addition of steroids.  ?acute pneumonitis versus progression of disease. P:   Goal SpO2>92 F/u CXR intermittently Change solumedrol to 60 mg q6h on 3/09 Albuterol PRN Abx per primary team   Noe Gens, NP-C [facility] Pgr: [phone] or [phone]  03/27/2013, 8:44 AM  Reviewed above, and examined.  He has A on C respiratory failure with pulmonary fibrosis/emphysema with significant progression on CT chest since June 2014.  May have acute component, but concerned much of this is progression of his disease.  Will try to decrease solumedrol as tolerated.  Defer further serology evaluation for now.  Abx per primary team.  Chesley Mires, MD [facility] Pulmonary/Critical Care 03/27/2013, 11:25 AM Pager:  [phone] After 3pm call: [phone]

## 2013-03-28 ENCOUNTER — Inpatient Hospital Stay (HOSPITAL_COMMUNITY): Payer: Medicare Other

## 2013-03-28 ENCOUNTER — Encounter (HOSPITAL_COMMUNITY): Payer: Medicare Other

## 2013-03-28 DIAGNOSIS — Z66 Do not resuscitate: Secondary | ICD-10-CM

## 2013-03-28 LAB — BASIC METABOLIC PANEL
BUN: 26 mg/dL — ABNORMAL HIGH (ref 6–23)
CHLORIDE: 105 meq/L (ref 96–112)
CO2: 23 mEq/L (ref 19–32)
Calcium: 9.2 mg/dL (ref 8.4–10.5)
Creatinine, Ser: 1.16 mg/dL (ref 0.50–1.35)
GFR calc Af Amer: 68 mL/min — ABNORMAL LOW (ref >90)
GFR calc non Af Amer: 58 mL/min — ABNORMAL LOW (ref >90)
Glucose, Bld: 173 mg/dL — ABNORMAL HIGH (ref 70–99)
POTASSIUM: 3.7 meq/L (ref 3.7–5.3)
SODIUM: 140 meq/L (ref 137–147)

## 2013-03-28 LAB — CBC
HEMATOCRIT: 29.7 % — AB (ref 39.0–52.0)
Hemoglobin: 10.2 g/dL — ABNORMAL LOW (ref 13.0–17.0)
MCH: 31.7 pg (ref 26.0–34.0)
MCHC: 34.3 g/dL (ref 30.0–36.0)
MCV: 92.2 fL (ref 78.0–100.0)
Platelets: 144 10*3/uL — ABNORMAL LOW (ref 150–400)
RBC: 3.22 MIL/uL — ABNORMAL LOW (ref 4.22–5.81)
RDW: 13.9 % (ref 11.5–15.5)
WBC: 12.3 10*3/uL — ABNORMAL HIGH (ref 4.0–10.5)

## 2013-03-28 LAB — GLUCOSE, CAPILLARY: Glucose-Capillary: 140 mg/dL — ABNORMAL HIGH (ref 70–99)

## 2013-03-28 MED ORDER — LEVOFLOXACIN IN D5W 750 MG/150ML IV SOLN
750.0000 mg | INTRAVENOUS | Status: DC
  Administered 2013-03-28 – 2013-03-29 (×2): 750 mg via INTRAVENOUS
  Filled 2013-03-28 (×2): qty 150

## 2013-03-28 MED ORDER — VANCOMYCIN HCL IN DEXTROSE 1-5 GM/200ML-% IV SOLN
1000.0000 mg | Freq: Two times a day (BID) | INTRAVENOUS | Status: DC
  Administered 2013-03-28 – 2013-03-29 (×3): 1000 mg via INTRAVENOUS
  Filled 2013-03-28 (×4): qty 200

## 2013-03-28 NOTE — Progress Notes (Signed)
Patient ID: James Pope, male   DOB: 1932-09-01, 80 y.o.   MRN: [id number] [facility] PROGRESS NOTE  SHOWN DISSINGER TGR:[id number] DOB: Jul 27, 1932 DOA: 04/13/2013 PCP: Laurena Slimmer, MD  Brief narrative: 80 yo male with h/o chronic resp failure secondary to pulmonary fibrosis, oxygen dependent, on 2 L oxygen at home, COPD, CKD who presented to [facility] ED 04/05/2013 with complaints of lower extremity swelling and weight gain for past one week prior to this admission. He reports shortness of breath but no chest pain or palpitations or orthopnea. On admission, his oxygen saturation was in need of 78 on 2 L nasal cannula oxygen support. Chest x-ray on admission showed no acute cardiopulmonary disease but stable chronic interstitial disease likely pulmonary fibrosis. On the morning of 03/25/2013 pt acutely decompensated and was very short of breath requiring ventimask above 50% to keep O2 saturation above 90%. Pr required transfer to SDU for further monitoring. He now requires BiPAP more frequently to keep O2 above 90%. PCCM consulted palliative care for goals of care.   Assessment/Plan:   Principal Problem:  Acute respiratory failure with hypoxia  - Likely secondary to combination of progression of pulmonary fibrosis and COPD exacerbation  - CXR on 3/7 showed continued diffuse interstitial prominence throughout the lungs, possibly a component of chronic interstitial lung disease versus a superimposed edema or infection. CT chest done 3/7 revealed moderate centrilobular and paraseptal emphysema, and findings related to an underlying chronic interstitial lung disease. This was favored to represent evolving usual interstitial pneumonia, likely with a superimposed acute interstitial pneumonia. No significant changes on subsequent CXR done 3/8 and 3/9  - oxygen saturation 90% this morning with BiPAP.  - added antibiotics for possible HCAP, sepsis (Vanco, Zosyn started 3/7, Levaquin started 3/6); added  tamiflu 3/7 howevere since respiratory viral panel negative we discontinued tamiflu 03/26/2013  - Continue solumedrol 60 mg IV Q 6 hours  - Continue Albuterol every 2 hours PRN  - Palliative care for goals of care Active Problems:  Probable HCAP  - as mentioned above: Vanco, Zosyn, Levaquin  - blood cultures to date are negative  - respiratory viral negative  Leukocytosis  - likely due to HCAP  - management with abx as mentioned above, vanco, Levaquin and zosyn  Anemia of chronic kidney disease  - hemoglobin 10.2 this am  - no indications for transfusion  Mild thrombocytopenia  - platelet count 144 this am  - heparin sub Q stopped 03/27/2013 - using SCD's for DVT prophylaxis  Hypokalemia  - Was given one dose of lasix previously for possible CHF. Hypokalemia likely due to that but now has resolved to WNL Acute on chronic renal failure, CKD stage 3  - creatinine in 03/2012 about 1.5  - creatinine normalized with IV fluids  Hypertension  - BP stable this am; we have restarted home Cardizem dose  BPH  - continue flomax   Code Status: DNR/DNI  Family Communication: no family at the bedside  Disposition Plan: remains inpatient   Consultants:  PCCM Palliative care for goals of care Procedures:  None  Antibiotics:  Levaquin 03/24/2013 -->  Zosyn 03/25/2013 -->  Vancomycin 03/25/2013 -->  Oseltamivir 03/25/2013 --> 03/26/2013  Manson Passey, MD  [facility] Pager [phone]  If 7PM-7AM, please contact night-coverage [email or web address] Password [facility] 03/28/2013, 6:23 PM   LOS: 6 days    HPI/Subjective: Now requires BiPAP.   Objective: Filed Vitals:   03/28/13 1000 03/28/13 1200 03/28/13 1400 03/28/13 1600  BP: 120/74 140/86  133/71 124/73  Pulse: 91 81 62   Temp:  97.6 F (36.4 C)  97.6 F (36.4 C)  TempSrc:  Oral  Axillary  Resp: _0 Height:      Weight:      SpO2: 94% 88% 95%     Intake/Output Summary (Last 24 hours) at 03/28/13 1823 Last data filed at  03/28/13 1800  Gross per 24 hour  Intake   3460 ml  Output   1450 ml  Net   2010 ml    Exam:  General: Pt is alert, follows commands appropriately, not in acute distress  Cardiovascular: Regular rate and rhythm, S1/S2 appreciated  Respiratory: on BiPAP, diminished breath sounds throughout Abdomen: Soft, non tender, non distended, bowel sounds present, no guarding  Extremities: No edema, pulses DP and PT palpable bilaterally  Neuro: Grossly nonfocal   Data Reviewed: Basic Metabolic Panel:  Recent Labs Lab 03/24/13 0315 03/25/13 0640 03/26/13 0002 03/27/13 0325 03/28/13 0320  NA 140 135* 135* 140 140  K 3.8 3.5* 5.3 4.2 3.7  CL 98 95* 96 103 105  CO2 _1 GLUCOSE 113* 131* 182* 148* 173*  BUN _2 27* 26*  CREATININE 1.26 1.35 1.35 1.43* 1.16  CALCIUM 9.4 9.1 9.5 9.4 9.2   Liver Function Tests:  Recent Labs Lab 03/25/13 0640  AST 28  ALT 11  ALKPHOS 63  BILITOT 1.3*  PROT 6.8  ALBUMIN 2.7*   No results found for this basename: LIPASE, AMYLASE,  in the last 168 hours No results found for this basename: AMMONIA,  in the last 168 hours CBC:  Recent Labs Lab 03/23/13 0328 03/25/13 0640 03/26/13 0002 03/27/13 0325 03/28/13 0320  WBC 10.7* 11.9* 8.2 14.8* 12.3*  NEUTROABS  --  9.7*  --   --   --   HGB 12.1* 12.4* 12.4* 11.0* 10.2*  HCT 36.0* 36.0* 37.4* 33.3* 29.7*  MCV 94.0 92.8 94.9 93.8 92.2  PLT 167 170 160 143* 144*   Cardiac Enzymes:  Recent Labs Lab 03/27/2013 2016 03/25/13 1327 03/25/13 1827 03/26/13 0002  TROPONINI <0.30 <0.30 <0.30 <0.30   BNP: No components found with this basename: POCBNP,  CBG:  Recent Labs Lab 03/26/13 2349 03/27/13 0803 03/27/13 1209 03/27/13 1719 03/28/13 0741  GLUCAP 131* 125* 136* 138* 140*    Recent Results (from the past 240 hour(s))  CULTURE, BLOOD (ROUTINE X 2)     Status: None   Collection Time    03/24/13 10:15 PM      Result Value Ref Range Status   Specimen Description BLOOD  RIGHT ARM   Final   Special Requests     Final   Value: BOTTLES DRAWN AEROBIC AND ANAEROBIC 10 CC BOTH BOTTLES   Culture  Setup Time     Final   Value: 03/25/2013 03:30     Performed at [facility]   Culture     Final   Value:        BLOOD CULTURE RECEIVED NO GROWTH TO DATE CULTURE WILL BE HELD FOR 5 DAYS BEFORE ISSUING A FINAL NEGATIVE REPORT     Performed at [facility]   Report Status PENDING   Incomplete  CULTURE, BLOOD (ROUTINE X 2)     Status: None   Collection Time    03/24/13 10:18 PM      Result Value Ref Range Status   Specimen Description BLOOD RIGHT WRIST   Final  Special Requests     Final   Value: BOTTLES DRAWN AEROBIC AND ANAEROBIC 10 CC BOTH BOTTLES   Culture  Setup Time     Final   Value: 03/25/2013 03:31     Performed at [facility]   Culture     Final   Value:        BLOOD CULTURE RECEIVED NO GROWTH TO DATE CULTURE WILL BE HELD FOR 5 DAYS BEFORE ISSUING A FINAL NEGATIVE REPORT     Performed at [facility]   Report Status PENDING   Incomplete  URINE CULTURE     Status: None   Collection Time    03/25/13  4:30 AM      Result Value Ref Range Status   Specimen Description URINE, RANDOM   Final   Special Requests NONE   Final   Culture  Setup Time     Final   Value: 03/25/2013 11:41     Performed at [place]     Final   Value: 20,OOO COLONIES/ML     Performed at [facility]   Culture     Final   Value: ESCHERICHIA COLI     Performed at [facility]   Report Status 03/27/2013 FINAL   Final   Organism ID, Bacteria ESCHERICHIA COLI   Final  RESPIRATORY VIRUS PANEL     Status: None   Collection Time    03/25/13  5:49 AM      Result Value Ref Range Status   Source - RVPAN NOSE   Final   Respiratory Syncytial Virus A NOT DETECTED   Final   Respiratory Syncytial Virus B NOT DETECTED   Final   Influenza A NOT DETECTED   Final   Influenza B NOT DETECTED   Final   Parainfluenza 1 NOT  DETECTED   Final   Parainfluenza 2 NOT DETECTED   Final   Parainfluenza 3 NOT DETECTED   Final   Metapneumovirus NOT DETECTED   Final   Rhinovirus NOT DETECTED   Final   Adenovirus NOT DETECTED   Final   Influenza A H1 NOT DETECTED   Final   Influenza A H3 NOT DETECTED   Final   Comment: (NOTE)           Normal Reference Range for each Analyte: NOT DETECTED     Testing performed using the Luminex xTAG Respiratory Viral Panel test     kit.     This test was developed and its performance characteristics determined     by [facility]. It has not been cleared or approved by the [place]     Food and Drug Administration. This test is used for clinical purposes.     It should not be regarded as investigational or for research. This     laboratory is certified under the [place] (CLIA) as qualified to perform high complexity     clinical laboratory testing.     Performed at [facility] PCR SCREENING     Status: None   Collection Time    03/25/13 11:45 AM      Result Value Ref Range Status   MRSA by PCR NEGATIVE  NEGATIVE Final   Comment:            The GeneXpert MRSA Assay (FDA     approved for NASAL specimens  only), is one component of a     comprehensive MRSA colonization     surveillance program. It is not     intended to diagnose MRSA     infection nor to guide or     monitor treatment for     MRSA infections.     Studies: Dg Chest Port 1 View  03/28/2013   CLINICAL DATA:  Airspace disease  EXAM: PORTABLE CHEST - 1 VIEW  COMPARISON:  Prior chest x-ray 03/27/2013; prior CT scan 03/25/2013  FINDINGS: Essentially stable extensive bilateral interstitial airspace disease with new developing alveolar opacities in the right mid lung. Of note, the patient is rotated to the right on today's examination. Background of emphysema. Cardiac and mediastinal contours are unchanged. No pneumothorax or pleural effusion. No acute  osseous abnormality.  IMPRESSION: 1. Developing alveolar opacity in the right mid lung concerning for progression of acute pneumonitis, developing bacterial pneumonia, or a region of focal atelectasis. 2. Otherwise, stable extensive bilateral predominantly interstitial changes consistent with acute on chronic interstitial pneumonitis and background emphysematous changes.   Electronically Signed   By: Jacqulynn Cadet M.D.   On: 03/28/2013 07:39   Dg Chest Port 1 View  03/27/2013   CLINICAL DATA:  Infiltrates.  EXAM: PORTABLE CHEST - 1 VIEW  COMPARISON:  DG CHEST 1V PORT dated 03/26/2013; CT CHEST W/O CM dated 03/25/2013; DG CHEST 1V PORT dated 03/25/2013  FINDINGS: Mediastinum and hilar structures are stable. Stable cardiomegaly is present. No pulmonary venous congestion noted. No pleural effusion or pneumothorax. Diffuse severe pulmonary interstitial lung disease is present. A chronic component of interstitial lung disease may be present however active interstitial disease cannot be excluded. There is no interim change from prior exam.  IMPRESSION: Persistent unchanged severe pulmonary interstitial lung disease.   Electronically Signed   By: Marcello Moores  Register   On: 03/27/2013 07:34    Scheduled Meds: . diltiazem  30 mg Oral BID  . ketotifen  1 drop Both Eyes BID  . levofloxacin (LEVAQUIN) IV  750 mg Intravenous Q24H  . methylPREDNISolone (SOLU-MEDROL) injection  60 mg Intravenous Q6H  . piperacillin-tazobactam (ZOSYN)  IV  3.375 g Intravenous Q8H  . sodium chloride  3 mL Intravenous Q12H  . tamsulosin  0.8 mg Oral QHS  . vancomycin  1,000 mg Intravenous Q12H   Continuous Infusions: . sodium chloride 100 mL/hr at 03/28/13 [id number]

## 2013-03-28 NOTE — Progress Notes (Signed)
ANTIBIOTIC CONSULT NOTE - Follow up  Pharmacy Consult for Vancomycin, Zosyn, Levaquin Indication: HCAP vs asp PNA and E.coli UTI  Allergies  Allergen Reactions  . Colcrys [Colchicine] Itching  . Other Other (See Comments)    Medication-NapraPAC (prevacid and naprosyn) Reaction-Hiccups  . Prednisone Other (See Comments)    Hiccups    Patient Measurements: Height: 5\' 10"  (177.8 cm) Weight: 180 lb 1.9 oz (81.7 kg) IBW/kg (Calculated) : 73  Vital Signs: Temp: 97.7 F (36.5 C) (03/10 0800) Temp src: Axillary (03/10 0800) BP: 132/86 mmHg (03/10 0921) Pulse Rate: 78 (03/10 0600) Intake/Output from previous day: 03/09 0701 - 03/10 0700 In: 1610 [P.O.:1110; I.V.:2100; IV Piggyback:400] Out: 1700 [Urine:1700] Intake/Output from this shift:    Labs:  Recent Labs  03/26/13 0002 03/27/13 0325 03/28/13 0320  WBC 8.2 14.8* 12.3*  HGB 12.4* 11.0* 10.2*  PLT 160 143* 144*  CREATININE 1.35 1.43* 1.16   Estimated Creatinine Clearance: 54.2 ml/min (by C-G formula based on Cr of 1.16).  Recent Labs  03/27/13 1315  [place] 12.9     Microbiology: 3/6 blood x2: ngtd 3/7 urine: 20k Ecoli (sens: ancef,CTX,gent/tobra,nitro,zosyn. Resis: amp,FQs,septra) 3/7 Resp virus panel: neg  Assessment: 80 yo male on day #4 broad spectrum antibiotics for AECOPD HCAP vs asp PNA.   3/7 >> Tamiflu >> 3/9 3/7 >> Levaquin >> 3/7 >> Vanc >> 3/7 >> Zosyn >>  Tmax: AF WBCs: elevated with steroid initiation 3/7 Renal: SCr now improved, CrCl 54 ml/min PCT: 0.18 -> 0.42 -> 0.24   3/6 blood x2: ngtd 3/7 urine: 20k Ecoli (sens: ancef,CTX,gent/tobra,nitro,Zosyn. Res: amp,FQs,septra) 3/7 Resp virus panel: neg  Goal of Therapy:  Appropriate antibiotic dosing for renal function; eradication of infection Vancomycin trough level 15-20 mcg/ml  Plan:   Increase Levaquin from 750mg  IV q48h to q24h d/t CrCl >50.  Increase Vanc to 1g IV q12h since trough low 3/9 and SCr improved.  Cont Zosyn  3.375g IV Q8H infused over 4hrs.  Follow up renal fxn and culture results, de-escalation of therapy  Peggyann Juba, PharmD, BCPS Pager: [phone] 03/28/2013,10:05 AM.

## 2013-03-28 NOTE — Progress Notes (Signed)
Comments:  Notified by RN that pt dropped sats into 80's while on NRB at 100%. She states pt was mildly tachypneic and stated he was having trouble breathing. RT at bedside, pt was placed back on Bi-PAP. Upon my arrival to bedside pt noted resting in NAD on BiPAP. 02 sats currently 97% on 100% fi02. Pt remains afebrile, BP-125/96 and HR-88. AM CXR pending. Assessment/Plan: 1.Hypoxia: In setting of pt w/ acute respiratory failure felt secondary to combination of COPD, worsening pulmonary fibrosis vs superimposed interstitial PNA vs CHF. 02 sats returned to 94-97% once back on BiPAP.  Will f/u AM CXR. Continue BiPAP for now. Continue close monitoring in SDU.  Jeryl Columbia, NP-C [facility] Pager [phone]

## 2013-03-28 NOTE — Progress Notes (Signed)
CARE MANAGEMENT NOTE 03/28/2013  Patient:  James Pope, James Pope   Account Number:  [id number]  Date Initiated:  03/24/2013  Documentation initiated by:  DAVIS,TYMEEKA  Subjective/Objective Assessment:   80 yo male admitted with weakness.PCP: Foye Spurling, MD     Action/Plan:   Home when stable   Anticipated DC Date:  2013/04/02   Anticipated DC Plan:  [place]  CM consult      [facility] Choice  HOME HEALTH   Choice offered to / List presented to:  C-3 Spouse   DME arranged  NA      DME agency  NA     [place] arranged  HH-1 RN  [place]   Status of service:  Completed, signed off Medicare Important Message given?   (If response is "NO", the following Medicare IM given date fields will be blank) Date Medicare IM given:   Date Additional Medicare IM given:    Discharge Disposition:  [place]  Per UR Regulation:  Reviewed for med. necessity/level of care/duration of stay  If discussed at [place] of Stay Meetings, dates discussed:   03/28/2013    Comments:  [id number]/Rhonda Rosana Hoes RN, BSN, [place] [phone] Chart Reviewed for discharge and hospital needs. Discharge needs at time of review:  None present will follow for needs. Review of patient progress due on [id number]. patient with resp. failure requiring bipap, not tolerating nrb venturi mask, desats easily down to the 70-80%  Mechele Claude, RN Registered Nurse Signed CASE MANAGEMENT Care Management Note Service date: 03/24/2013 11:31 AM Cm spoke with patient at the bedside concerning discharge planning. Per pt on home oxygen therapy. Cm spoke with patient's spouse who verified that patient's oxygen provided by [facility]. Pt's spouse to bring portable tank to hospital prior to discharge. MD orders for [facility]. Pt's spouse provided choice for [facility]. Per pt's spouse Arville Go to provide [facility]  services. No other needs identified.

## 2013-03-28 NOTE — Progress Notes (Signed)
Patient James Pope      DOB: May 20, 1932      NWG:[id number]  Spoke with James Pope.  She needs to call us back after asking her son what time they are coming.  She suspects they will be in in the morning.  Will accommodate her the best we can as quickly as we can.     Taren Dymek L. Lovena Le, MD MBA The Palliative Medicine Team at [facility] Phone: [phone] Pager: [phone]

## 2013-03-28 NOTE — Progress Notes (Signed)
PULMONARY / CRITICAL CARE MEDICINE  Name: James Pope MRN: [id number] DOB: 10/18/1932    ADMISSION DATE:  04/18/2013 CONSULTATION DATE:  03/25/2013  REFERRING MD :  [facility] PRIMARY SERVICE:  TRH  CHIEF COMPLAINT:  Hypoxemia  BRIEF PATIENT DESCRIPTION: 80 y/o with past medical history of pulmonary fibrosis on home 3L oxygen brought to ED on 3/4 and admitted with hypoxia. On 3/7 developed acute respiratory distress and was transferred to ICU.  SIGNIFICANT EVENTS: 3/04  Admitted with progressive hypoxia 3/07  Transferred to ICU, DNR/DNI established, BiPAP 3/10  Episode of desaturation/SOB overnight while on NRB, returned to bipap with improvement in sx  STUDIES:  3/05  TTE >>> EF 65%, normal RV size and function 3/07  Chest CT >>> Emphysema, ILD ( worse appearance vs superimposed interstitial pneumonia )  LINES / TUBES: Foley 3/8 >>>  CULTURES: 3/6 Blood >>> 3/7 Urine >>> ESCHERICHIA COLI ( 20K)>>>res cipro, amp, levaquin / sens zosyn 3/7 RVP >>> negative  ANTIBIOTICS: Zosyn 3/7 >>> Vancomycin 3/7 >>> Levaquin 3/7 >>>  INTERVAL HISTORY:   Noted desaturations overnight requiring bipap.  No other acute events  VITAL SIGNS: Temp:  [97.6 F (36.4 C)-98.2 F (36.8 C)] 97.7 F (36.5 C) (03/10 0450) Pulse Rate:  [28-108] 78 (03/10 0600) Resp:  [18-30] 18 (03/10 0600) BP: (123-143)/(68-96) 129/89 mmHg (03/10 0600) SpO2:  [88 %-100 %] 96 % (03/10 0600) FiO2 (%):  [100 %] 100 % (03/10 0515) Weight:  [180 lb 1.9 oz (81.7 kg)] 180 lb 1.9 oz (81.7 kg) (03/10 0452)  INTAKE / OUTPUT: Intake/Output     03/09 0701 - 03/10 0700 03/10 0701 - 03/11 0700   P.O. 1110    I.V. (mL/kg) 2100 (25.7)    IV Piggyback 400    Total Intake(mL/kg) 3610 (44.2)    Urine (mL/kg/hr) 1700 (0.9)    Total Output 1700     Net +1910            PHYSICAL EXAMINATION: General:  Appears acutely ill, no distress Neuro:  Awake, appropriate, on bipap HEENT:  PERRL, dry membranes Cardiovascular:   Regular, no murmurs Lungs:  Bilateral diminished air entry, rales Abdomen:  Soft, nontender, bowel sounds diminished Musculoskeletal:  Moves all extremities, no edema Skin:  Intact  LABS: CBC  Recent Labs Lab 03/26/13 0002 03/27/13 0325 03/28/13 0320  WBC 8.2 14.8* 12.3*  HGB 12.4* 11.0* 10.2*  HCT 37.4* 33.3* 29.7*  PLT 160 143* 144*   BMET  Recent Labs Lab 03/26/13 0002 03/27/13 0325 03/28/13 0320  NA 135* 140 140  K 5.3 4.2 3.7  CL 96 103 105  CO2 27 24 23   BUN 19 27* 26*  CREATININE 1.35 1.43* 1.16  GLUCOSE 182* 148* 173*   Electrolytes  Recent Labs Lab 03/26/13 0002 03/27/13 0325 03/28/13 0320  CALCIUM 9.5 9.4 9.2   Sepsis Markers  Recent Labs Lab 03/25/13 0647 03/26/13 0002 03/27/13 0325  PROCALCITON 0.18 0.42 0.24   ABG  Recent Labs Lab 03/25/13 0531 03/25/13 1130  PHART 7.487* 7.490*  PCO2ART 36.1 32.9*  PO2ART 112.0* 42.0*   Liver Enzymes  Recent Labs Lab 03/25/13 0640  AST 28  ALT 11  ALKPHOS 63  BILITOT 1.3*  ALBUMIN 2.7*   Cardiac Enzymes  Recent Labs Lab 04/08/2013 2016 03/23/13 0328 03/25/13 1327 03/25/13 1827 03/26/13 0002  TROPONINI <0.30  --  <0.30 <0.30 <0.30  PROBNP 134.2 123.8  --   --   --    Glucose  Recent Labs  Lab 03/26/13 1558 03/26/13 2031 03/26/13 2349 03/27/13 0803 03/27/13 1209 03/27/13 1719  GLUCAP 161* 174* 131* 125* 136* 138*   IMAGING:  Dg Chest Port 1 View  03/28/2013   CLINICAL DATA:  Airspace disease  EXAM: PORTABLE CHEST - 1 VIEW  COMPARISON:  Prior chest x-ray 03/27/2013; prior CT scan 03/25/2013  FINDINGS: Essentially stable extensive bilateral interstitial airspace disease with new developing alveolar opacities in the right mid lung. Of note, the patient is rotated to the right on today's examination. Background of emphysema. Cardiac and mediastinal contours are unchanged. No pneumothorax or pleural effusion. No acute osseous abnormality.  IMPRESSION: 1. Developing alveolar  opacity in the right mid lung concerning for progression of acute pneumonitis, developing bacterial pneumonia, or a region of focal atelectasis. 2. Otherwise, stable extensive bilateral predominantly interstitial changes consistent with acute on chronic interstitial pneumonitis and background emphysematous changes.   Electronically Signed   By: Jacqulynn Cadet M.D.   On: 03/28/2013 07:39   Dg Chest Port 1 View  03/27/2013   CLINICAL DATA:  Infiltrates.  EXAM: PORTABLE CHEST - 1 VIEW  COMPARISON:  DG CHEST 1V PORT dated 03/26/2013; CT CHEST W/O CM dated 03/25/2013; DG CHEST 1V PORT dated 03/25/2013  FINDINGS: Mediastinum and hilar structures are stable. Stable cardiomegaly is present. No pulmonary venous congestion noted. No pleural effusion or pneumothorax. Diffuse severe pulmonary interstitial lung disease is present. A chronic component of interstitial lung disease may be present however active interstitial disease cannot be excluded. There is no interim change from prior exam.  IMPRESSION: Persistent unchanged severe pulmonary interstitial lung disease.   Electronically Signed   By: Marcello Moores  Register   On: 03/27/2013 07:34   ASSESSMENT / PLAN:  A:   Acute on chronic respiratory failure with hx of pulmonary fibrosis and emphysema.  He has marked progression of fibrosis on CT chest from June 2014 to 25-Mar-2013.  Reports subjective improvement with addition of steroids.  ?acute pneumonitis versus progression of disease. P:   Goal SpO2>88% F/u CXR intermittently Changed solumedrol to 60 mg q6h on 3/09 Albuterol PRN Abx per primary team Intermittent bipap, there may come a time when it will be no longer appropriate to use NIMV Palliative Care consult for ongoing goals of care - pt is DNR / DNI Consider narrow abx (vanco)  Noe Gens, NP-C [facility] Pgr: [phone] or [phone]  03/28/2013, 8:19 AM  Reviewed above, and examined.  He is now requiring intermittent BiPAP.  I am  concerned that much of his respiratory issues are related to progression of his pulmonary fibrosis with emphysema, and perhaps an acute pneumonitis/respiratory infection.  As such I am not sure how much further improvement he will be able to achieve >> my suspicion is not very much.  Continue with current Abx, and solumedrol for now.  Will ask palliative care to assist with discussions about further goals of care >> may be appropriate to consider hospice if he does not show further improvement with current therapies.  Chesley Mires, MD [facility] Pulmonary/Critical Care 03/28/2013, 9:00 AM Pager:  [phone] After 3pm call: [phone]

## 2013-03-28 NOTE — Progress Notes (Signed)
Pt with o2 sats 85% on nrb at 100%. RT at bedside to place pt back on bipap. [facility] called awaiting call back.

## 2013-03-29 DIAGNOSIS — D638 Anemia in other chronic diseases classified elsewhere: Secondary | ICD-10-CM | POA: Diagnosis present

## 2013-03-29 DIAGNOSIS — E876 Hypokalemia: Secondary | ICD-10-CM | POA: Diagnosis present

## 2013-03-29 DIAGNOSIS — I519 Heart disease, unspecified: Secondary | ICD-10-CM

## 2013-03-29 DIAGNOSIS — B370 Candidal stomatitis: Secondary | ICD-10-CM | POA: Diagnosis present

## 2013-03-29 DIAGNOSIS — I5189 Other ill-defined heart diseases: Secondary | ICD-10-CM | POA: Diagnosis present

## 2013-03-29 DIAGNOSIS — N400 Enlarged prostate without lower urinary tract symptoms: Secondary | ICD-10-CM | POA: Diagnosis present

## 2013-03-29 DIAGNOSIS — J441 Chronic obstructive pulmonary disease with (acute) exacerbation: Secondary | ICD-10-CM

## 2013-03-29 DIAGNOSIS — Z515 Encounter for palliative care: Secondary | ICD-10-CM

## 2013-03-29 DIAGNOSIS — D696 Thrombocytopenia, unspecified: Secondary | ICD-10-CM | POA: Diagnosis present

## 2013-03-29 DIAGNOSIS — J9620 Acute and chronic respiratory failure, unspecified whether with hypoxia or hypercapnia: Secondary | ICD-10-CM

## 2013-03-29 LAB — GLUCOSE, CAPILLARY: Glucose-Capillary: 133 mg/dL — ABNORMAL HIGH (ref 70–99)

## 2013-03-29 MED ORDER — MORPHINE (PF) INJECTION FOR INHALATION 10 MG/ML
10.0000 mg | RESPIRATORY_TRACT | Status: DC | PRN
  Administered 2013-03-29 (×2): 10 mg via RESPIRATORY_TRACT
  Filled 2013-03-29 (×2): qty 1

## 2013-03-29 MED ORDER — IPRATROPIUM-ALBUTEROL 0.5-2.5 (3) MG/3ML IN SOLN: 3.0000 mL | RESPIRATORY_TRACT | Status: DC

## 2013-03-29 MED ORDER — ALBUTEROL SULFATE (2.5 MG/3ML) 0.083% IN NEBU: 2.5000 mg | INHALATION_SOLUTION | Freq: Four times a day (QID) | RESPIRATORY_TRACT | Status: DC

## 2013-03-29 MED ORDER — LORAZEPAM 2 MG/ML IJ SOLN
0.5000 mg | Freq: Once | INTRAMUSCULAR | Status: AC
  Administered 2013-03-29: 0.5 mg via INTRAVENOUS

## 2013-03-29 MED ORDER — FUROSEMIDE 10 MG/ML IJ SOLN
40.0000 mg | Freq: Once | INTRAMUSCULAR | Status: AC
  Administered 2013-03-29: 40 mg via INTRAVENOUS
  Filled 2013-03-29: qty 4

## 2013-03-29 MED ORDER — ALBUTEROL SULFATE (2.5 MG/3ML) 0.083% IN NEBU: 2.5000 mg | INHALATION_SOLUTION | RESPIRATORY_TRACT | Status: DC | PRN

## 2013-03-29 MED ORDER — FLUCONAZOLE IN SODIUM CHLORIDE 200-0.9 MG/100ML-% IV SOLN
200.0000 mg | Freq: Once | INTRAVENOUS | Status: AC
  Administered 2013-03-29: 200 mg via INTRAVENOUS
  Filled 2013-03-29: qty 100

## 2013-03-29 MED ORDER — LORAZEPAM 2 MG/ML IJ SOLN
0.5000 mg | INTRAMUSCULAR | Status: DC | PRN
  Administered 2013-03-29 (×2): 0.5 mg via INTRAVENOUS
  Filled 2013-03-29 (×2): qty 1

## 2013-03-29 MED ORDER — IPRATROPIUM-ALBUTEROL 0.5-2.5 (3) MG/3ML IN SOLN
3.0000 mL | Freq: Four times a day (QID) | RESPIRATORY_TRACT | Status: DC
  Administered 2013-03-29 (×3): 3 mL via RESPIRATORY_TRACT
  Filled 2013-03-29 (×3): qty 3

## 2013-03-29 MED ORDER — OXYMETAZOLINE HCL 0.05 % NA SOLN
2.0000 | Freq: Two times a day (BID) | NASAL | Status: DC
  Administered 2013-03-29: 2 via NASAL
  Filled 2013-03-29: qty 15

## 2013-03-29 MED ORDER — MORPHINE SULFATE 2 MG/ML IJ SOLN
0.5000 mg | INTRAMUSCULAR | Status: DC | PRN
  Administered 2013-03-29 (×7): 0.5 mg via INTRAVENOUS
  Filled 2013-03-29 (×6): qty 1

## 2013-03-30 ENCOUNTER — Encounter (HOSPITAL_COMMUNITY): Payer: Medicare Other

## 2013-03-31 LAB — CULTURE, BLOOD (ROUTINE X 2)
CULTURE: NO GROWTH
Culture: NO GROWTH

## 2013-03-31 NOTE — Discharge Summary (Addendum)
Death Summary      James Pope SWN:[id number] DOB: Jun 19, 1932 DOA: 04/14/13  PCP: Foye Spurling, MD PCP/Office notified: 04-23-2013   Admit date: 2013-04-14 Date of Death: 04-21-13  Final Diagnoses:  Principal Problem:    Sepsis and acute-on-chronic respiratory failure secondary to end stage COPD with PNA and pulmonary fibrosis contributory Active Problems:    Sepsis secondary to HCAP    HTN (hypertension)    Stage II-III chronic kidney disease    Gout    Pulmonary fibrosis    Weakness    COPD with exacerbation    Usual interstitial pneumonia with superimposed acute interstitial pneumonia    DNR (do not resuscitate)    Anemia of chronic disease    Hypokalemia    Thrombocytopenia, unspecified    BPH (benign prostatic hyperplasia)    Diastolic dysfunction    Palliative care patient    Oropharyngeal candidiasis    E Coli UTI versus colonization (urine culture grew 20,000 colonies)    History of present illness:  James Pope is an 80 y.o. male with a PMH of home oxygen dependent chronic resp failure secondary to pulmonary fibrosis, COPD, CKD who presented to [facility] ED 04/14/13 with complaints of lower extremity swelling and weight gain for one week prior to admission. He reports shortness of breath but no chest pain or palpitations or orthopnea. On admission, his oxygen saturation was 78 percent on 2 L nasal cannula oxygen support. Chest x-ray on admission showed no acute cardiopulmonary disease but stable chronic interstitial disease likely pulmonary fibrosis.  Hospital Course:  Patient was initially admitted with hypoxic respiratory failure, transferred to the ICU and placed on BiPAP 03/25/13 secondary to worsening respiratory status. PCCM consulted. Patient has been treated with a combination of Solu-Medrol, with empiric vancomycin, Zosyn and Levaquin therapy initiated on 03/25/13 for the development of sepsis thought to be secondary to HCAP. Respiratory virus  panel negative. CT of the chest done 03/25/13 showed moderate emphysema, chronic interstitial lung disease, involving usual interstitial pneumonia with a superimposed acute interstitial pneumonia.  Palliative care meeting subsequently held with the family to establish goals of care given the patient's poor prognosis.  He was transitioned to full comfort care on 21-Apr-2013 and was taken off Bipap for comfort.  He was treated with supplemental oxygen via nasal cannula, low dose IV morphine and low dose IV Ativan for symptom control.  He expired at 10:15 pm.  Time of death: 10:15 pm 04/21/2013.  Signed:  Harveer Sadler  [facility] 2013/04/23, 2:51 PM   **Disclaimer: This note was dictated with voice recognition software. Similar sounding words can inadvertently be transcribed and this note may contain transcription errors which may not have been corrected upon publication of note.**

## 2013-04-10 ENCOUNTER — Ambulatory Visit: Payer: Medicare Other | Admitting: Cardiovascular Disease

## 2013-04-19 NOTE — Progress Notes (Signed)
Per MD orders: Pt taken off of Bipap and placed on 4L nasal cannula.  Bipap has been discontinued, pt is comfort care.

## 2013-04-19 NOTE — Progress Notes (Signed)
Called wife and advised heart pattern had changed. Advised we were giving meds to make him comfortable. She said she would come. Also talked with son and gave update. resp at bedside giving nebs

## 2013-04-19 NOTE — Consult Note (Addendum)
Met with James Pope, his wife and his son [name] , [name] and other son [name] by phone to discuss goals of care.  1. DNR 2. Primary goal is Quality of Life/Comfort- length of life if possible but not in a debilitated state. 3. Family very aware of the severity of his pulmonary fibrosis-that this is a terminal condition. Even with very aggressive treatment and intervention he will likely not survive his current state.  4. Will remove BiPap for comfort-place on nasal cannula/NRB O2 at 4L-give nebs for bronchodilation atrovent and albuterol. Will also add morphine nebulizer. 5. Continue steroids, antibiotics for full treatment course. 6. Low dose IV morphine PRN 7. Ativan low dose for air hunger PRN 8. If he stabilizes could consider [employer]- family knows he may not survive for very long after removal of bipap and comfort transition-but prognositication difficult-he has full capacity. 9. Oral mucosa with significant thrush-will give IV fluconazole for comfort-he had prior severe candida esophagitis a few months ago.  Full note to follow.  Lane Hacker, DO Palliative Medicine

## 2013-04-19 NOTE — Progress Notes (Signed)
PROGRESS NOTE   James Pope James Pope DOB: 1932-07-16 DOA: 03/20/2013 PCP: James Spurling, MD  Brief narrative: James Pope is an 80 y.o. male with a PMH of home oxygen dependent chronic resp failure secondary to pulmonary fibrosis, COPD, CKD who presented to [facility] ED 03/21/2013 with complaints of lower extremity swelling and weight gain for one week prior to admission. He reports shortness of breath but no chest pain or palpitations or orthopnea. On admission, his oxygen saturation was 78 percent on 2 L nasal cannula oxygen support. Chest x-ray on admission showed no acute cardiopulmonary disease but stable chronic interstitial disease likely pulmonary fibrosis. On the morning of 03/25/2013 pt acutely decompensated and was very short of breath requiring ventimask above 50% to keep O2 saturation above 90%. Pr required transfer to SDU for further monitoring. He now requires BiPAP more frequently to keep O2 above 90%. PCCM consulted. Palliative care meeting scheduled for goals of care.    Assessment/Plan: Principal Problem:   Acute-on-chronic respiratory failure secondary to progressive pulmonary fibrosis, usual interstitial pneumonia, acute interstitial pneumonia and COPD exacerbation Patient was initially admitted with hypoxic respiratory failure, transferred to the ICU and placed on BiPAP 03/25/13 secondary to worsening respiratory status.  PCCM consulted.  Patient has been treated with a combination of Solu-Medrol, with empiric vancomycin, Zosyn and Levaquin therapy initiated on 03/25/13. Respiratory virus panel negative. CT of the chest done 03/25/13 showed moderate emphysema, chronic interstitial lung disease, involving usual interstitial pneumonia with a superimposed acute interstitial pneumonia.  Continue steroids, empiric antibiotics, and albuterol as needed. Continue supplemental oxygen. Followup results of palliative care meeting. Active Problems:   Diastolic dysfunction, grade 1 Noted  on 2-D echo done 03/23/13. Preserved EF.   Steroid-induced hyperglycemia Await goals of care. If continued aggressive care wanted, we'll start on sliding scale insulin.   Stage II-III CKD (chronic kidney disease) Baseline creatinine 1.1-1.2. Current creatinine at usual baseline values. Avoid nephrotoxins.   Weakness PT/OT evaluations when able.   Anemia of chronic disease Hemoglobin stable with no current indication for transfusion.   Thrombocytopenia Mild. No current evidence for bleeding.   Hypokalemia Monitor and replace potassium as needed.   Hypertension Continue Cardizem.   BPH Continue Flomax.   DVT Prophylaxis Continue SCDs.    Code Status: DNR Family Communication: No family is at the bedside. Disposition Plan: Home vs. SNF depending on progress.   IV access:  Peripheral IV  Medical Consultants:  Dr. Chesley Mires, PCCM  Dr. Billey Chang, Palliative care  Other Consultants:  None.  Anti-infectives:  Vancomycin 03/25/13--->  Zosyn 03/25/13--->  Levaquin 03/25/13--->  HPI/Subjective: James Pope is short of breath, and currently has Bipap in place.  He has some chest tightness.  Occasional cough, non-productive.  Objective: Filed Vitals:   04-22-13 0200 April 22, 2013 0357 04/22/13 0400 2013/04/22 0600  BP: 148/79 132/63 132/63 117/88  Pulse: 63 58 59 75  Temp:   97.9 F (36.6 C)   TempSrc:   Axillary   Resp: _0 Height:      Weight:   86.2 kg (190 lb 0.6 oz)   SpO2: 91% 93% 92% 89%    Intake/Output Summary (Last 24 hours) at 04/22/2013 0716 Last data filed at April 22, 2013 0600  Gross per 24 hour  Intake 3202.5 ml  Output    735 ml  Net 2467.5 ml    Exam: Gen:  NAD Cardiovascular:  RRR, No M/R/G Respiratory:  Lungs CTAB Gastrointestinal:  Abdomen disttended,  non-tender with +BS Extremities:  No C/E/C, SCDs on  Data Reviewed: Basic Metabolic Panel:  Recent Labs Lab 03/24/13 0315 03/25/13 0640 03/26/13 0002 03/27/13 0325 03/28/13 0320   NA 140 135* 135* 140 140  K 3.8 3.5* 5.3 4.2 3.7  CL 98 95* 96 103 105  CO2 _0 GLUCOSE 113* 131* 182* 148* 173*  BUN _1 27* 26*  CREATININE 1.26 1.35 1.35 1.43* 1.16  CALCIUM 9.4 9.1 9.5 9.4 9.2   GFR Estimated Creatinine Clearance: 54.2 ml/min (by C-G formula based on Cr of 1.16). Liver Function Tests:  Recent Labs Lab 03/25/13 0640  AST 28  ALT 11  ALKPHOS 63  BILITOT 1.3*  PROT 6.8  ALBUMIN 2.7*    CBC:  Recent Labs Lab 03/23/13 0328 03/25/13 0640 03/26/13 0002 03/27/13 0325 03/28/13 0320  WBC 10.7* 11.9* 8.2 14.8* 12.3*  NEUTROABS  --  9.7*  --   --   --   HGB 12.1* 12.4* 12.4* 11.0* 10.2*  HCT 36.0* 36.0* 37.4* 33.3* 29.7*  MCV 94.0 92.8 94.9 93.8 92.2  PLT 167 170 160 143* 144*   Cardiac Enzymes:  Recent Labs Lab 03/31/2013 2016 03/25/13 1327 03/25/13 1827 03/26/13 0002  TROPONINI <0.30 <0.30 <0.30 <0.30   BNP (last 3 results)  Recent Labs  03/24/2013 2016 03/23/13 0328  PROBNP 134.2 123.8   CBG:  Recent Labs Lab 03/26/13 2349 03/27/13 0803 03/27/13 1209 03/27/13 1719 03/28/13 0741  GLUCAP 131* 125* 136* 138* 140*   Microbiology Recent Results (from the past 240 hour(s))  CULTURE, BLOOD (ROUTINE X 2)     Status: None   Collection Time    03/24/13 10:15 PM      Result Value Ref Range Status   Specimen Description BLOOD RIGHT ARM   Final   Special Requests     Final   Value: BOTTLES DRAWN AEROBIC AND ANAEROBIC 10 CC BOTH BOTTLES   Culture  Setup Time     Final   Value: 03/25/2013 03:30     Performed at [facility]   Culture     Final   Value:        BLOOD CULTURE RECEIVED NO GROWTH TO DATE CULTURE WILL BE HELD FOR 5 DAYS BEFORE ISSUING A FINAL NEGATIVE REPORT     Performed at [facility]   Report Status PENDING   Incomplete  CULTURE, BLOOD (ROUTINE X 2)     Status: None   Collection Time    03/24/13 10:18 PM      Result Value Ref Range Status   Specimen Description BLOOD RIGHT WRIST   Final    Special Requests     Final   Value: BOTTLES DRAWN AEROBIC AND ANAEROBIC 10 CC BOTH BOTTLES   Culture  Setup Time     Final   Value: 03/25/2013 03:31     Performed at [facility]   Culture     Final   Value:        BLOOD CULTURE RECEIVED NO GROWTH TO DATE CULTURE WILL BE HELD FOR 5 DAYS BEFORE ISSUING A FINAL NEGATIVE REPORT     Performed at [facility]   Report Status PENDING   Incomplete  URINE CULTURE     Status: None   Collection Time    03/25/13  4:30 AM      Result Value Ref Range Status   Specimen Description URINE, RANDOM   Final   Special  Requests NONE   Final   Culture  Setup Time     Final   Value: 03/25/2013 11:41     Performed at [place]     Final   Value: 20,OOO COLONIES/ML     Performed at [facility]   Culture     Final   Value: ESCHERICHIA COLI     Performed at [facility]   Report Status 03/27/2013 FINAL   Final   Organism ID, Bacteria ESCHERICHIA COLI   Final  RESPIRATORY VIRUS PANEL     Status: None   Collection Time    03/25/13  5:49 AM      Result Value Ref Range Status   Source - RVPAN NOSE   Final   Respiratory Syncytial Virus A NOT DETECTED   Final   Respiratory Syncytial Virus B NOT DETECTED   Final   Influenza A NOT DETECTED   Final   Influenza B NOT DETECTED   Final   Parainfluenza 1 NOT DETECTED   Final   Parainfluenza 2 NOT DETECTED   Final   Parainfluenza 3 NOT DETECTED   Final   Metapneumovirus NOT DETECTED   Final   Rhinovirus NOT DETECTED   Final   Adenovirus NOT DETECTED   Final   Influenza A H1 NOT DETECTED   Final   Influenza A H3 NOT DETECTED   Final   Comment: (NOTE)           Normal Reference Range for each Analyte: NOT DETECTED     Testing performed using the Luminex xTAG Respiratory Viral Panel test     kit.     This test was developed and its performance characteristics determined     by [facility]. It has not been cleared or approved by the [place]      Food and Drug Administration. This test is used for clinical purposes.     It should not be regarded as investigational or for research. This     laboratory is certified under the [place] (CLIA) as qualified to perform high complexity     clinical laboratory testing.     Performed at [place] PCR SCREENING     Status: None   Collection Time    03/25/13 11:45 AM      Result Value Ref Range Status   MRSA by PCR NEGATIVE  NEGATIVE Final   Comment:            The GeneXpert MRSA Assay (FDA     approved for NASAL specimens     only), is one component of a     comprehensive MRSA colonization     surveillance program. It is not     intended to diagnose MRSA     infection nor to guide or     monitor treatment for     MRSA infections.     Procedures and Diagnostic Studies: Dg Chest 2 View  03/23/2013   CLINICAL DATA:  Shortness of breath.  EXAM: CHEST  2 VIEW  COMPARISON:  04/10/2013 as well as chest CT 07/08/2012  FINDINGS: Lungs are hypoinflated with persistent stable interstitial disease likely pulmonary fibrosis. No focal airspace consolidation or effusion. Cardiomediastinal silhouette and remainder of the exam is unchanged.  IMPRESSION: No acute cardiopulmonary disease.  Hypoinflation with chronic stable interstitial disease likely pulmonary fibrosis.   Electronically Signed  By: Marin Olp M.D.   On: 03/23/2013 10:53   Dg Chest 2 View (if Patient Has Fever And/or Copd)  03/30/2013   CLINICAL DATA:  SHORTNESS OF BREATH WEAKNESS  EXAM: CHEST  2 VIEW  COMPARISON:  CT CHEST W/O CM dated 07/08/2012; DG CHEST 2 VIEW dated 04/28/2012  FINDINGS: Low lung volumes. Cardiac silhouette is enlarged. Aorta is tortuous and ectatic. There is diffuse thickening of the interstitial markings, areas of peribronchial cuffing, and mild diffuse ground-glass density throughout both lungs. No focal regions of consolidation are appreciated. The osseous  structures unremarkable.  IMPRESSION: Diffuse interstitial infiltrate accentuated by the low lung volumes. Underlying component of pulmonary edema is of diagnostic consideration as well as chronic bronchitic changes.   Electronically Signed   By: Margaree Mackintosh M.D.   On: 04/14/2013 21:26   Ct Chest Wo Contrast  03/25/2013   CLINICAL DATA:  Hypoxia.  Known history of pulmonary fibrosis.  EXAM: CT CHEST WITHOUT CONTRAST  TECHNIQUE: Multidetector CT imaging of the chest was performed following the standard protocol without IV contrast.  COMPARISON:  Chest CT 07/08/2012.  FINDINGS: Mediastinum: Heart size is normal. There is no significant pericardial fluid, thickening or pericardial calcification. There is atherosclerosis of the thoracic aorta, the great vessels of the mediastinum and the coronary arteries, including calcified atherosclerotic plaque in the left main, left anterior descending, left circumflex and right coronary arteries. Numerous borderline enlarged mediastinal and hilar lymph nodes, similar to the prior examination, presumably reactive in the setting of chronic interstitial lung disease. Esophagus is unremarkable in appearance.  Lungs/Pleura: There is a background of moderate centrilobular and paraseptal emphysema, most pronounced throughout the lung apices bilaterally. Mild diffuse bronchial wall thickening. Compared to the prior study there is a marked increase in patchy areas of ground-glass attenuation that are most pronounced throughout the mid and lower lungs bilaterally, particularly in the lower lobes. This is associated with increased thickening of the peribronchovascular interstitium, with peribronchovascular and subpleural reticulation. Several areas of potential honeycombing appear to be developing, however, these areas may alternatively reflect chronic emphysematous changes that are accentuated by the worsening surrounding ground-glass attenuation and interstitial prominence. These  regions are most profound in the posterior aspect of the upper lobes of the lungs bilaterally, best appreciated on the sagittal reconstructions immediately above the major fissures bilaterally. High-resolution imaging was not ordered and not performed. No pleural effusions.  Upper Abdomen: 3 mm calcification in the upper pole of the right kidney may be vascular, or could be a nonobstructive calculus.  Musculoskeletal: There are no aggressive appearing lytic or blastic lesions noted in the visualized portions of the skeleton.  IMPRESSION: 1. The appearance of the lungs has dramatically changed compared to the prior study from 07/08/2012. There is again a background of moderate centrilobular and paraseptal emphysema, and there are findings that are undoubtedly related to an underlying chronic interstitial lung disease. Given the appearance on the prior examination, this is favored to represent evolving usual interstitial pneumonia, likely with a superimposed acute interstitial pneumonia at this time. Repeat high-resolution chest CT in 3-6 months after resolution of the patient's acute symptoms would be useful to reassess for temporal changes in the baseline appearance of the lung parenchyma. 2. Atherosclerosis, including left main and 3 vessel coronary artery disease. Assessment for potential risk factor modification, dietary therapy or pharmacologic therapy may be warranted, if clinically indicated. 3. Additional incidental findings, as above.   Electronically Signed   By: Mauri Brooklyn.D.  On: 03/25/2013 18:28   Dg Chest Port 1 View  03/28/2013   CLINICAL DATA:  Airspace disease  EXAM: PORTABLE CHEST - 1 VIEW  COMPARISON:  Prior chest x-ray 03/27/2013; prior CT scan 03/25/2013  FINDINGS: Essentially stable extensive bilateral interstitial airspace disease with new developing alveolar opacities in the right mid lung. Of note, the patient is rotated to the right on today's examination. Background of  emphysema. Cardiac and mediastinal contours are unchanged. No pneumothorax or pleural effusion. No acute osseous abnormality.  IMPRESSION: 1. Developing alveolar opacity in the right mid lung concerning for progression of acute pneumonitis, developing bacterial pneumonia, or a region of focal atelectasis. 2. Otherwise, stable extensive bilateral predominantly interstitial changes consistent with acute on chronic interstitial pneumonitis and background emphysematous changes.   Electronically Signed   By: Jacqulynn Cadet M.D.   On: 03/28/2013 07:39   Dg Chest Port 1 View  03/27/2013   CLINICAL DATA:  Infiltrates.  EXAM: PORTABLE CHEST - 1 VIEW  COMPARISON:  DG CHEST 1V PORT dated 03/26/2013; CT CHEST W/O CM dated 03/25/2013; DG CHEST 1V PORT dated 03/25/2013  FINDINGS: Mediastinum and hilar structures are stable. Stable cardiomegaly is present. No pulmonary venous congestion noted. No pleural effusion or pneumothorax. Diffuse severe pulmonary interstitial lung disease is present. A chronic component of interstitial lung disease may be present however active interstitial disease cannot be excluded. There is no interim change from prior exam.  IMPRESSION: Persistent unchanged severe pulmonary interstitial lung disease.   Electronically Signed   By: [place]   On: 03/27/2013 07:34   Dg Chest Port 1 View  03/26/2013   CLINICAL DATA:  Check endotracheal tube.  EXAM: PORTABLE CHEST - 1 VIEW  COMPARISON:  03/25/2013  FINDINGS: Unchanged low lung volumes with diffuse interstitial opacity. Based on chest CT, likely progressive interstitial lung disease. No effusion, new opacification or pneumothorax. Reportedly the patient is on BiPAP, not intubated. No cardiomegaly.  IMPRESSION: Unchanged low lung volumes and diffuse interstitial opacity.   Electronically Signed   By: Jorje Guild M.D.   On: 03/26/2013 06:15   Dg Chest Port 1 View  03/25/2013   CLINICAL DATA:  Shortness of breath.  EXAM: PORTABLE CHEST - 1 VIEW   COMPARISON:  03/24/2013  FINDINGS: Diffuse interstitial prominence throughout the lungs is stable since prior study. This could represent chronic interstitial disease although an acute process such as infection or edema cannot be excluded. No significant change since recent study, but findings are substantially progressed since April 2014 study.  Heart is upper limits normal in size. No visible effusions. No acute bony abnormality.  IMPRESSION: Continued diffuse interstitial prominence throughout the lungs. While I component of this may be chronic interstitial lung disease, I cannot exclude superimposed edema or infection.   Electronically Signed   By: Rolm Baptise M.D.   On: 03/25/2013 14:48   Dg Chest Port 1 View  03/24/2013   CLINICAL DATA:  Fever and shortness of breath  EXAM: PORTABLE CHEST - 1 VIEW  COMPARISON:  03/23/2013  FINDINGS: Cardiac shadow is stable. The lungs are well aerated bilaterally. Diffuse interstitial changes are again identified. No definitive acute infiltrate is seen. No sizable effusion is noted. No bony abnormality is seen.  IMPRESSION: Chronic changes without acute abnormality.   Electronically Signed   By: Inez Catalina M.D.   On: 03/24/2013 22:02    Scheduled Meds: . diltiazem  30 mg Oral BID  . ketotifen  1 drop Both Eyes BID  .  levofloxacin (LEVAQUIN) IV  750 mg Intravenous Q24H  . methylPREDNISolone (SOLU-MEDROL) injection  60 mg Intravenous Q6H  . piperacillin-tazobactam (ZOSYN)  IV  3.375 g Intravenous Q8H  . sodium chloride  3 mL Intravenous Q12H  . tamsulosin  0.8 mg Oral QHS  . vancomycin  1,000 mg Intravenous Q12H   Continuous Infusions: . sodium chloride 100 mL/hr at 04-22-2013 0410    Time spent: 35 minutes.  The patient requires high complexity decision making.   LOS: 7 days   [facility] Pager [phone]. If unable to reach me by pager, please call my cell phone at [phone].  *Please note that the hospitalists switch teams on  Wednesdays. Please call the flow manager at [phone] if you are having difficulty reaching the hospitalist taking care of this patient as she can update you and provide the most up-to-date pager number of provider caring for the patient. If 8PM-8AM, please contact night-coverage at [email or web address], password [facility]  04-22-2013, 7:16 AM    **Disclaimer: This note was dictated with voice recognition software. Similar sounding words can inadvertently be transcribed and this note may contain transcription errors which may not have been corrected upon publication of note.**

## 2013-04-19 NOTE — Progress Notes (Signed)
Pt in cardiopulmonary arrest. No respirations no pulse asystole on the monitor and no heart tones. Verified by two RNs Family at bedside Celene Skeen and [employer]

## 2013-04-19 NOTE — Progress Notes (Signed)
PULMONARY / CRITICAL CARE MEDICINE  Name: James Pope MRN: [id number] DOB: 01/15/1933    ADMISSION DATE:  03/25/2013 CONSULTATION DATE:  03/25/2013  REFERRING MD :  [facility] PRIMARY SERVICE:  TRH  CHIEF COMPLAINT:  Hypoxemia  BRIEF PATIENT DESCRIPTION: 80 y/o with past medical history of pulmonary fibrosis on home 3L oxygen brought to ED on 3/4 and admitted with hypoxia. On 3/7 developed acute respiratory distress and was transferred to ICU.  SIGNIFICANT EVENTS: 3/04  Admitted with progressive hypoxia 3/07  Transferred to ICU, DNR/DNI established, BiPAP 3/10  Episode of desaturation/SOB overnight while on NRB, returned to bipap with improvement in sx 3/11  Required bipap overnight and into am, desaturates quickly when off bipap  STUDIES:  3/05  TTE >>> EF 65%, normal RV size and function 3/07  Chest CT >>> Emphysema, ILD ( worse appearance vs superimposed interstitial pneumonia )  LINES / TUBES: Foley 3/8 >>>  CULTURES: 3/6 Blood >>> 3/7 Urine >>> ESCHERICHIA COLI ( 20K)>>>res cipro, amp, levaquin / sens zosyn 3/7 RVP >>> negative  ANTIBIOTICS: Zosyn 3/7 >>> Vancomycin 3/7 >>> Levaquin 3/7 >>>  INTERVAL HISTORY:   RN reports significant desaturations with removal of bipap.  PT requesting food / water.    VITAL SIGNS: Temp:  [97.4 F (36.3 C)-98.1 F (36.7 C)] 98.1 F (36.7 C) (03/11 0802) Pulse Rate:  [58-84] 75 (03/11 0600) Resp:  [20-28] 25 (03/11 0600) BP: (117-167)/(63-88) 117/88 mmHg (03/11 0600) SpO2:  [88 %-95 %] 89 % (03/11 0600) FiO2 (%):  [100 %] 100 % (03/11 0802) Weight:  [190 lb 0.6 oz (86.2 kg)] 190 lb 0.6 oz (86.2 kg) (03/11 0400)  INTAKE / OUTPUT: Intake/Output     03/10 0701 - 03/11 0700 03/11 0701 - 03/12 0700   P.O. 240    I.V. (mL/kg) 2300 (26.7)    IV Piggyback 662.5    Total Intake(mL/kg) 3202.5 (37.2)    Urine (mL/kg/hr) 735 (0.4)    Total Output 735     Net +2467.5          Urine Occurrence 1 x      PHYSICAL EXAMINATION: General:   Appears acutely ill, no distress Neuro:  Awake, appropriate, on bipap HEENT:  PERRL, dry membranes Cardiovascular:  Regular, no murmurs Lungs:  Bilateral diminished air entry, rales Abdomen:  Soft, nontender, bowel sounds diminished Musculoskeletal:  Moves all extremities, no edema Skin:  Intact  LABS: CBC  Recent Labs Lab 03/26/13 0002 03/27/13 0325 03/28/13 0320  WBC 8.2 14.8* 12.3*  HGB 12.4* 11.0* 10.2*  HCT 37.4* 33.3* 29.7*  PLT 160 143* 144*   BMET  Recent Labs Lab 03/26/13 0002 03/27/13 0325 03/28/13 0320  NA 135* 140 140  K 5.3 4.2 3.7  CL 96 103 105  CO2 27 24 23   BUN 19 27* 26*  CREATININE 1.35 1.43* 1.16  GLUCOSE 182* 148* 173*   Electrolytes  Recent Labs Lab 03/26/13 0002 03/27/13 0325 03/28/13 0320  CALCIUM 9.5 9.4 9.2   Sepsis Markers  Recent Labs Lab 03/25/13 0647 03/26/13 0002 03/27/13 0325  PROCALCITON 0.18 0.42 0.24   ABG  Recent Labs Lab 03/25/13 0531 03/25/13 1130  PHART 7.487* 7.490*  PCO2ART 36.1 32.9*  PO2ART 112.0* 42.0*   Liver Enzymes  Recent Labs Lab 03/25/13 0640  AST 28  ALT 11  ALKPHOS 63  BILITOT 1.3*  ALBUMIN 2.7*   Cardiac Enzymes  Recent Labs Lab 03/23/2013 2016 03/23/13 0328 03/25/13 1327 03/25/13 1827 03/26/13 0002  TROPONINI <  0.30  --  <0.30 <0.30 <0.30  PROBNP 134.2 123.8  --   --   --    Glucose  Recent Labs Lab 03/26/13 2349 03/27/13 0803 03/27/13 1209 03/27/13 1719 03/28/13 0741 2013/04/18 0746  GLUCAP 131* 125* 136* 138* 140* 133*   IMAGING:  Dg Chest Port 1 View  03/28/2013   CLINICAL DATA:  Airspace disease  EXAM: PORTABLE CHEST - 1 VIEW  COMPARISON:  Prior chest x-ray 03/27/2013; prior CT scan 03/25/2013  FINDINGS: Essentially stable extensive bilateral interstitial airspace disease with new developing alveolar opacities in the right mid lung. Of note, the patient is rotated to the right on today's examination. Background of emphysema. Cardiac and mediastinal contours are  unchanged. No pneumothorax or pleural effusion. No acute osseous abnormality.  IMPRESSION: 1. Developing alveolar opacity in the right mid lung concerning for progression of acute pneumonitis, developing bacterial pneumonia, or a region of focal atelectasis. 2. Otherwise, stable extensive bilateral predominantly interstitial changes consistent with acute on chronic interstitial pneumonitis and background emphysematous changes.   Electronically Signed   By: Jacqulynn Cadet M.D.   On: 03/28/2013 07:39   ASSESSMENT / PLAN:  A:   Acute on chronic respiratory failure with hx of pulmonary fibrosis and emphysema.  He has marked progression of fibrosis on CT chest from June 2014 to March 2015.  Reports subjective improvement with addition of steroids.  ?acute pneumonitis versus progression of disease.  P:   Goal SpO2>88% F/u CXR intermittently Changed solumedrol to 60 mg q6h on 3/09 Albuterol PRN Abx per primary team Intermittent bipap, there may come a time when it will be no longer appropriate to use NIMV in setting of aspiration risk Palliative Care consult for ongoing goals of care - pt is DNR / DNI Consider narrow abx (vanco)  Noe Gens, NP-C [facility] Pgr: [phone] or [phone]  04-18-2013, 10:53 AM  Reviewed above, and examined.  Had palliative care meeting this AM >> continue current medical therapy expect will d/c BiPAP.  DNR/DNI confirmed.  If medical status deteriorates further, then transition to comfort care.  Chesley Mires, MD [facility] Pulmonary/Critical Care 04-18-2013, 11:51 AM Pager:  [phone] After 3pm call: [phone]

## 2013-04-19 NOTE — Progress Notes (Signed)
Pt and family had a palliative care meeting and determined the pts goals of care. Per wishes of the family and pt we will DC the Bipap and only continue with comfort care for the pt. RRT has been been made aware of the care plan. Pt is alert and oriented at this time, BP and HR stable, 02 sat at pts baseline. Will continue to monitor.

## 2013-04-19 DEATH — deceased

## 2015-02-24 IMAGING — CR DG CHEST 2V
2 series · 2 of 2 positions shown · non-contrast
Comparison: Chest x-ray of 04/12/2012

CLINICAL DATA: Pneumonia, follow-up intermittent cough, former
smoker

CHEST - 2 VIEW

[view not recorded (1 of 2)]
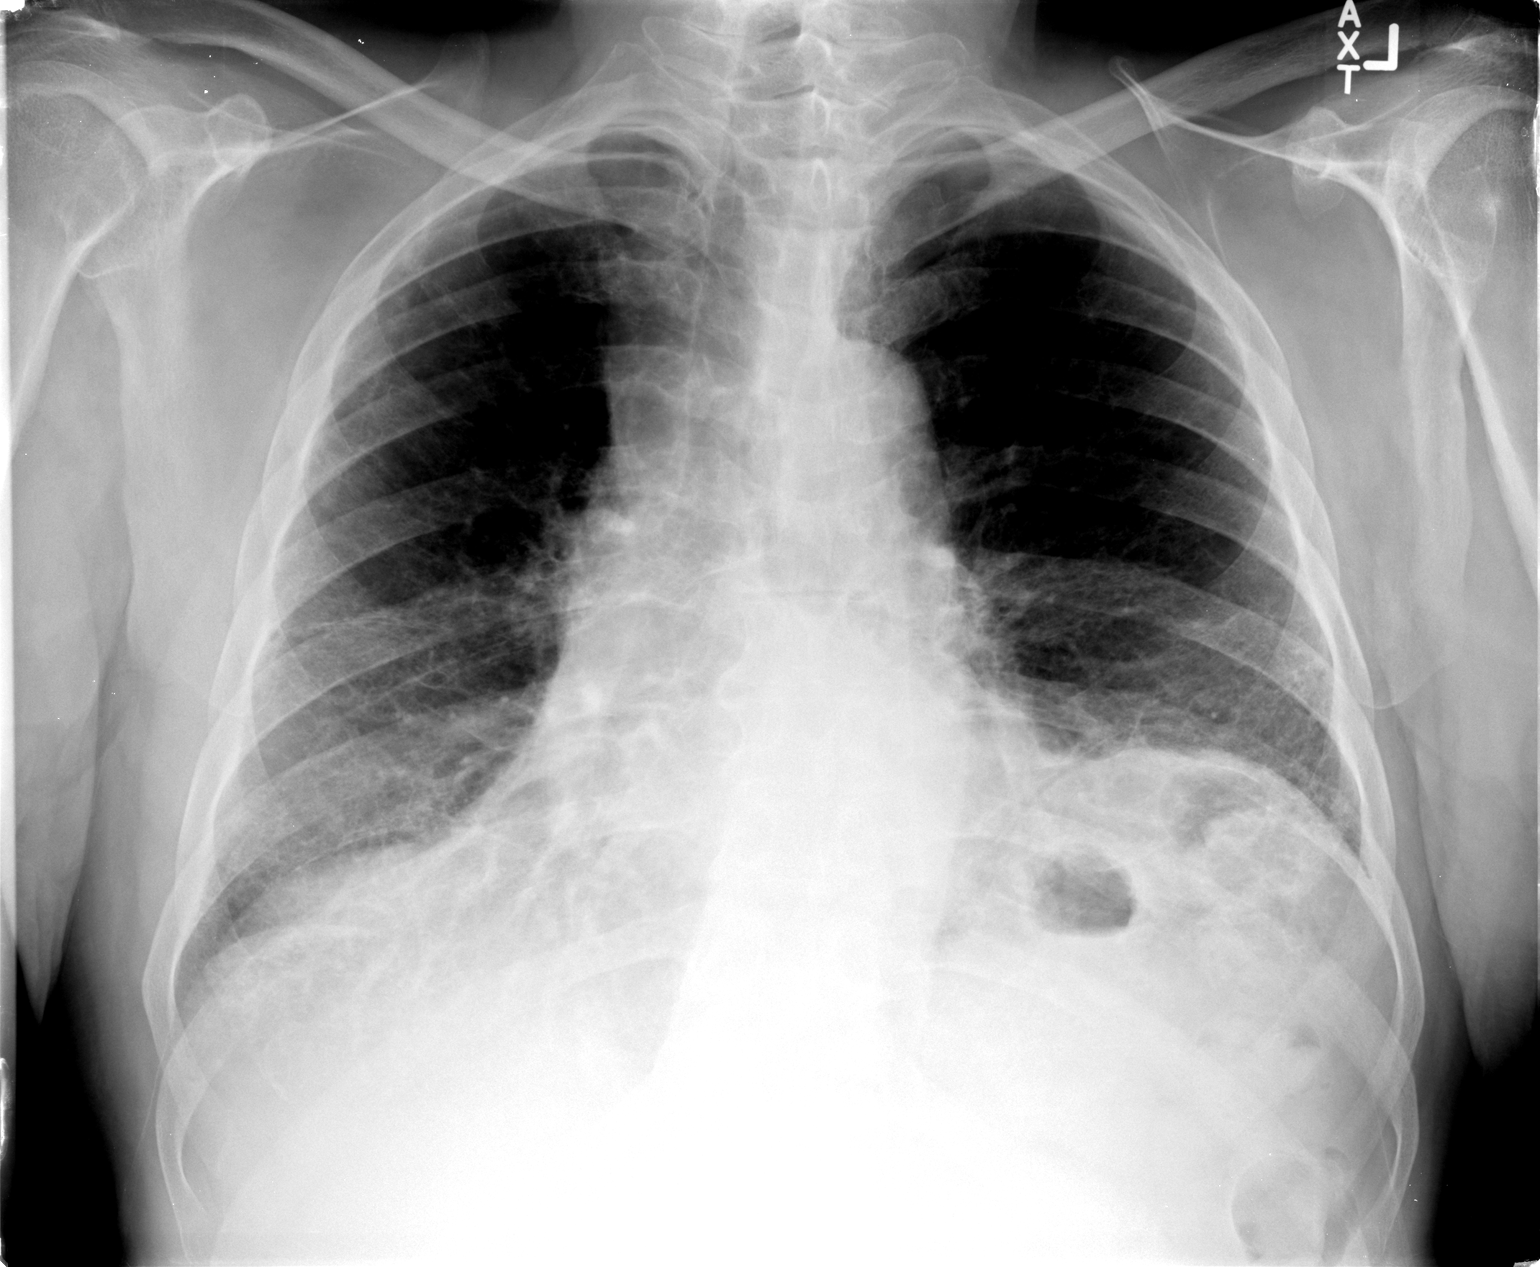

[view not recorded (2 of 2)]
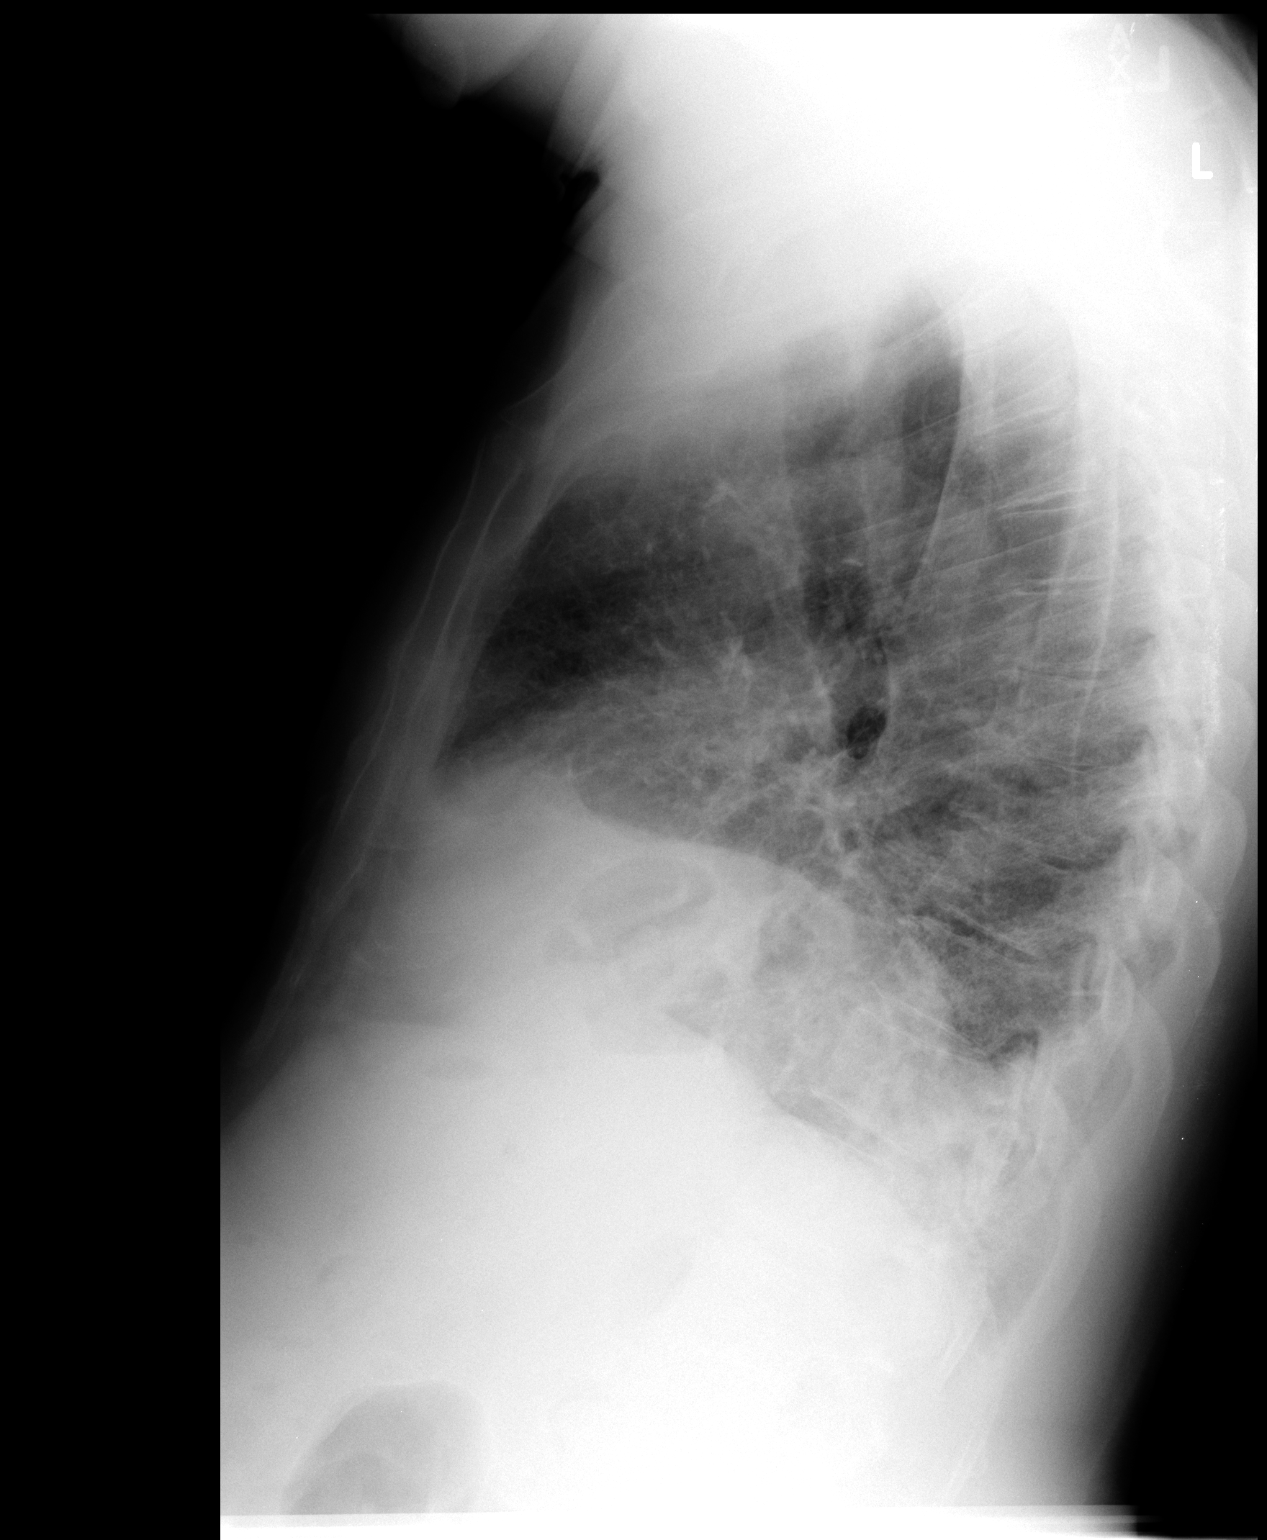

[2 of 2 positions shown; findings below may reference images not displayed]

FINDINGS: Basilar fibrosis appears stable.  No focal infiltrate or
effusion is seen.  Slight elevation of the left hemidiaphragm
remains.  Mild cardiomegaly is stable.  No acute bony abnormality
is seen.
IMPRESSION: Stable basilar fibrosis and mild cardiomegaly.  No active lung
disease.

## 2015-05-06 IMAGING — CT CT CHEST W/O CM
2 of 5 series · 15 of 36 positions shown, 18 images · non-contrast
Comparison: Chest radiograph, 04/28/2012.  Chest CT, 08/07/2011

CLINICAL DATA: History of prostate carcinoma and pulmonary
fibrosis.  Short of breath.

CT CHEST WITHOUT CONTRAST
TECHNIQUE: Multidetector CT imaging of the chest was performed
following the standard protocol without IV contrast.

[Series 2: chest with st · axial · 0.72mm/px · z∈[-288,-22]mm · 12 of 61 slices shown, 15 images]
[im 4/61  mediastinal]
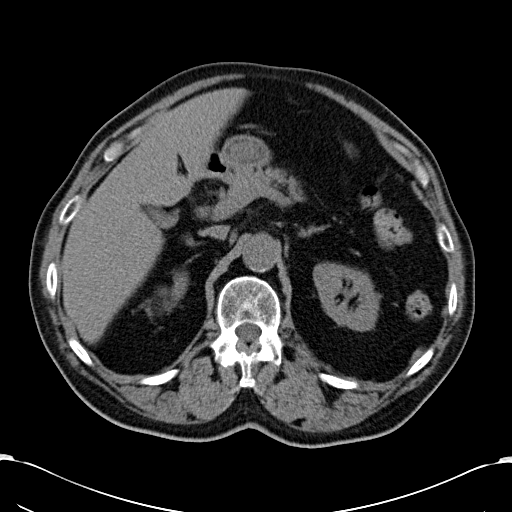
[im 4/61  lung]
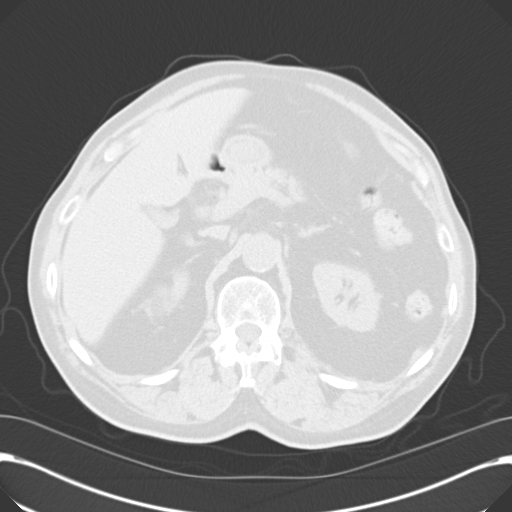
[im 10/61  lung]
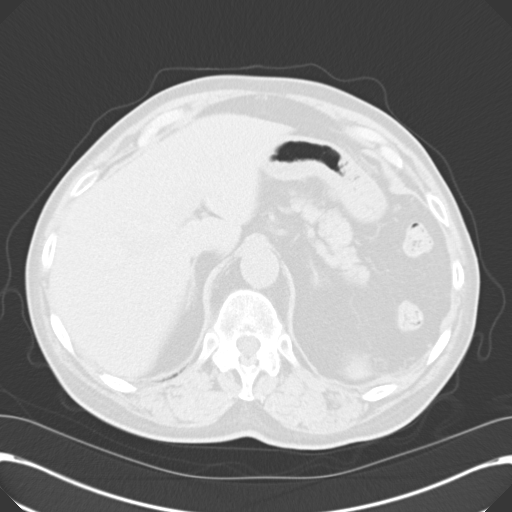
[im 13/61  lung]
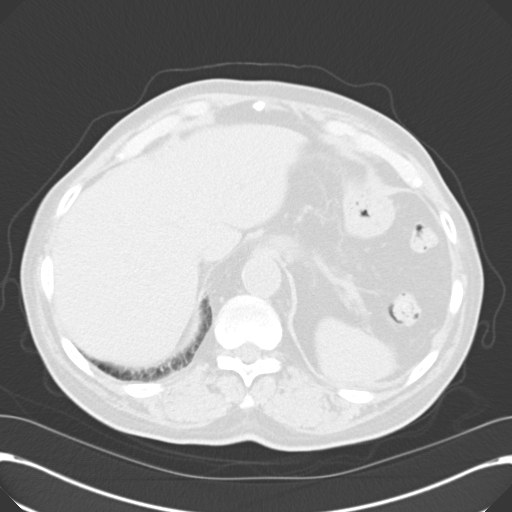
[im 19/61  lung]
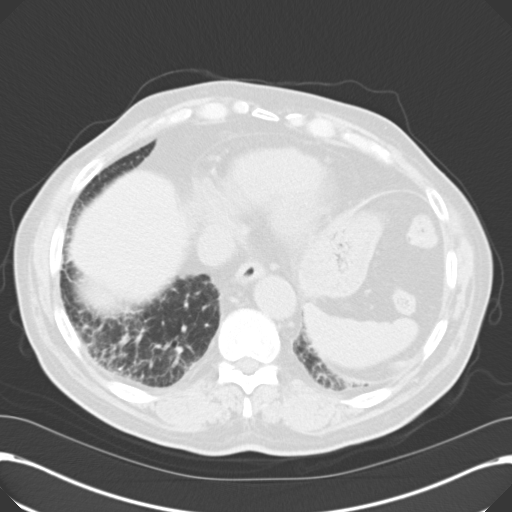
[im 23/61  mediastinal]
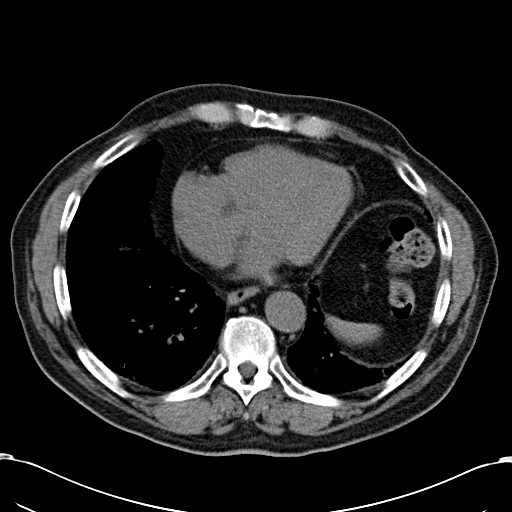
[im 23/61  lung]
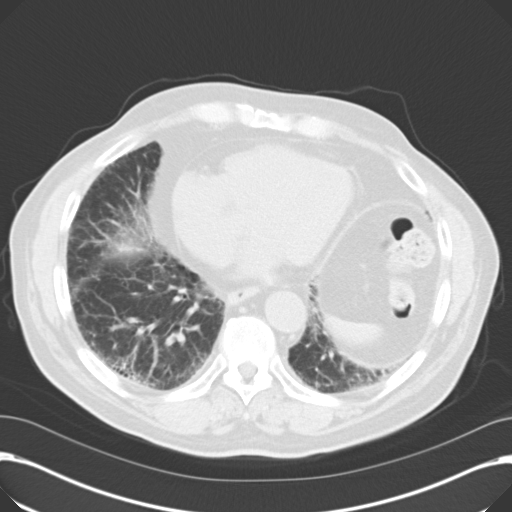
[im 29/61  lung]
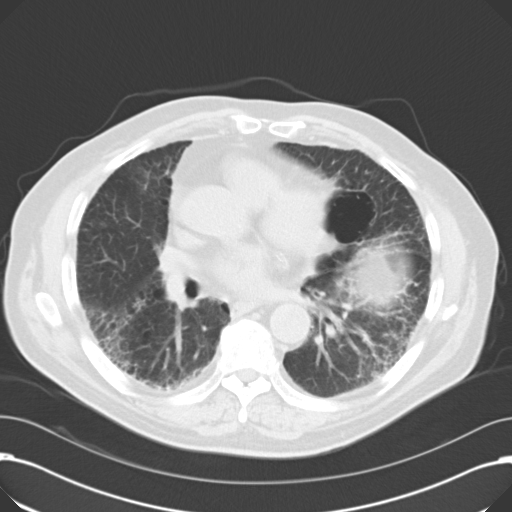
[im 32/61  lung]
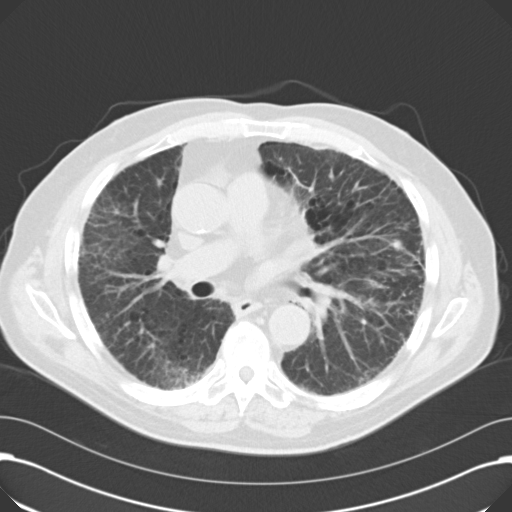
[im 38/61  lung]
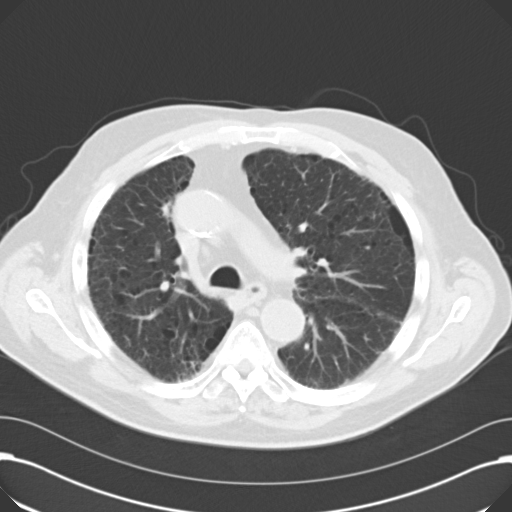
[im 42/61  mediastinal]
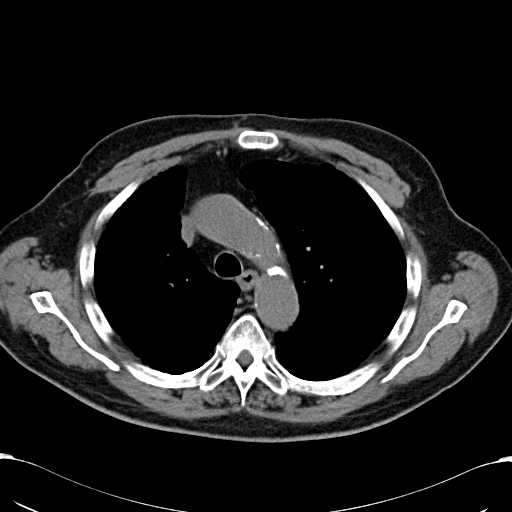
[im 42/61  lung]
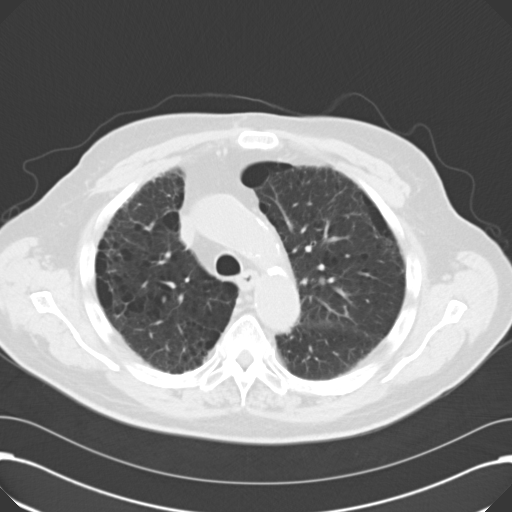
[im 48/61  lung]
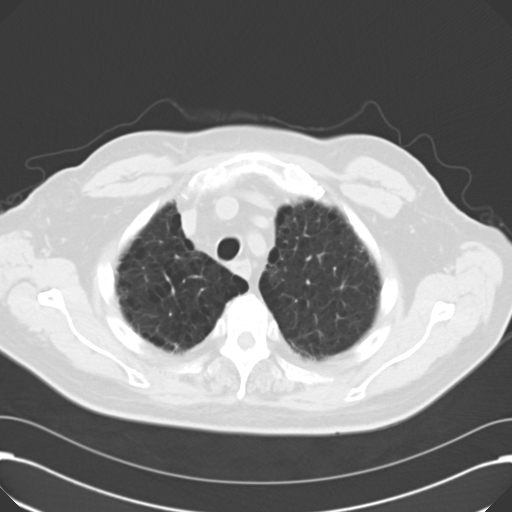
[im 51/61  lung]
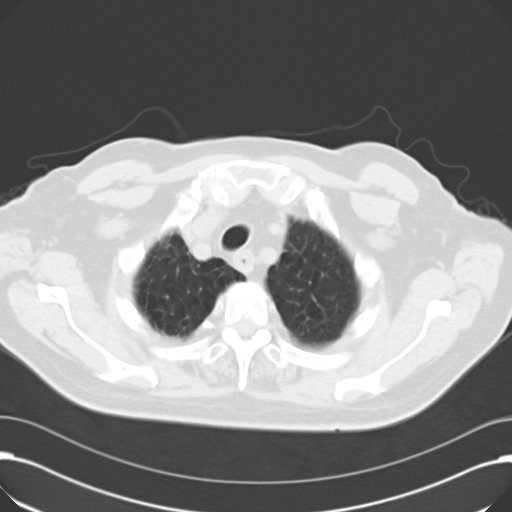
[im 57/61  lung]
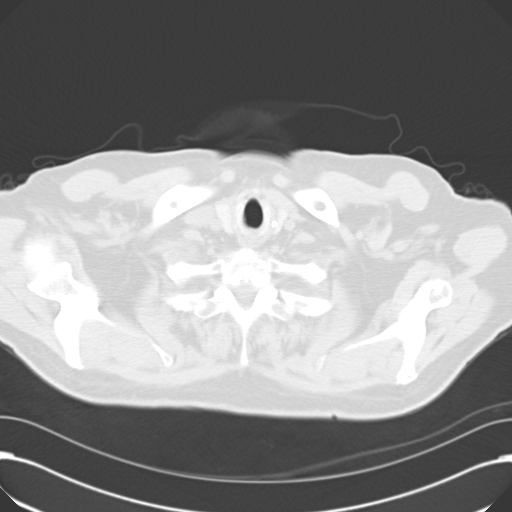

[Series 602: <mpr thick range> · coronal · 0.72mm/px · 3 of 83 slices shown]
[im 17/83  lung]
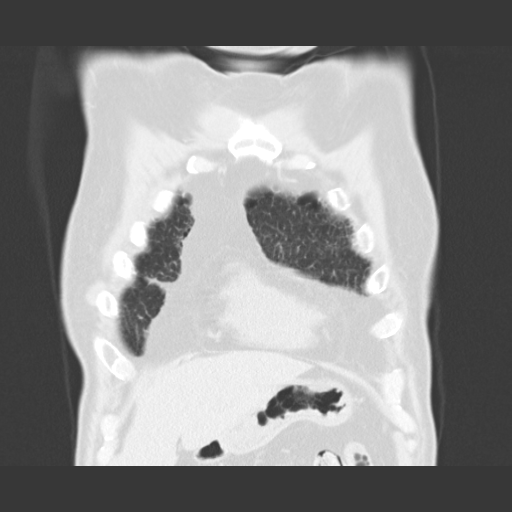
[im 33/83  lung]
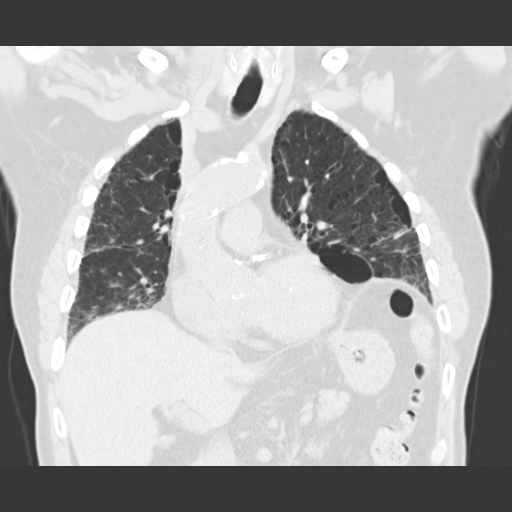
[im 50/83  lung]
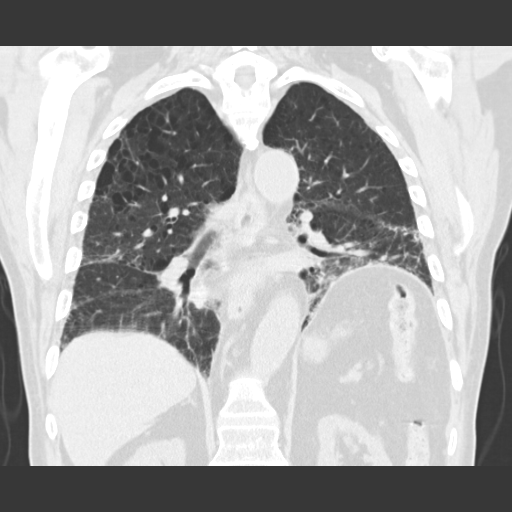

[15 of 36 positions shown; findings below may reference images not displayed]

FINDINGS: There are coarse reticular opacities that predominate
peripherally in a heterogeneous distribution, but are most evident
in the lung bases, as well as multiple areas of paraseptal
emphysema, which is stable from the prior exam. High resolution
imaging was also performed, acquired in inspiration and expiration.
There is no significant air trapping.  Minor bronchiectasis is
evident in the lower lobes.  No pulmonary nodule or mass.  No area
of consolidation is seen to suggest an acute infiltrate.  There is
no pleural effusion or pneumothorax.

The heart is normal in size and configuration.  There are moderate
coronary artery calcifications.  Calcified atherosclerotic plaque
extends along the thoracic aorta.  The great vessels are normal in
caliber.  No mediastinal or hilar masses or enlarged lymph nodes.

There is elevation of the left hemidiaphragm which is stable.
Limited evaluation of the upper abdomen is otherwise unremarkable.

There are degenerative changes along the thoracic spine, stable.
No osteoblastic or osteolytic lesions.
IMPRESSION: No acute finding.

Changes of interstitial fibrosis and paraseptal emphysema are
stable from the prior study.
